# Patient Record
Sex: Female | Born: 1940 | Race: White | Hispanic: No | Marital: Married | Smoking: Former smoker
Health system: Southern US, Community
[De-identification: ages and names within clinical notes are randomized; demographics above are authoritative.]

## PROBLEM LIST (undated history)

## (undated) DIAGNOSIS — R519 Headache, unspecified: Secondary | ICD-10-CM

## (undated) DIAGNOSIS — I509 Heart failure, unspecified: Secondary | ICD-10-CM

## (undated) DIAGNOSIS — I429 Cardiomyopathy, unspecified: Secondary | ICD-10-CM

## (undated) DIAGNOSIS — M1990 Unspecified osteoarthritis, unspecified site: Secondary | ICD-10-CM

## (undated) DIAGNOSIS — F32A Depression, unspecified: Secondary | ICD-10-CM

## (undated) DIAGNOSIS — I2510 Atherosclerotic heart disease of native coronary artery without angina pectoris: Secondary | ICD-10-CM

## (undated) DIAGNOSIS — R569 Unspecified convulsions: Secondary | ICD-10-CM

## (undated) DIAGNOSIS — E785 Hyperlipidemia, unspecified: Secondary | ICD-10-CM

## (undated) DIAGNOSIS — Q780 Osteogenesis imperfecta: Secondary | ICD-10-CM

## (undated) DIAGNOSIS — G459 Transient cerebral ischemic attack, unspecified: Secondary | ICD-10-CM

## (undated) DIAGNOSIS — I10 Essential (primary) hypertension: Secondary | ICD-10-CM

## (undated) HISTORY — PX: CARDIAC CATHETERIZATION: SHX172

## (undated) HISTORY — PX: APPENDECTOMY: SHX54

## (undated) HISTORY — PX: CHOLECYSTECTOMY: SHX55

## (undated) HISTORY — PX: PATELLA FRACTURE SURGERY: SHX735

## (undated) HISTORY — PX: ABDOMINAL HYSTERECTOMY: SHX81

## (undated) HISTORY — PX: FRACTURE SURGERY: SHX138

---

## 1998-01-02 ENCOUNTER — Inpatient Hospital Stay (HOSPITAL_COMMUNITY): Admission: EM | Admit: 1998-01-02 | Discharge: 1998-01-04 | Payer: Self-pay | Admitting: Emergency Medicine

## 1998-01-08 ENCOUNTER — Emergency Department (HOSPITAL_COMMUNITY): Admission: EM | Admit: 1998-01-08 | Discharge: 1998-01-08 | Payer: Self-pay | Admitting: Emergency Medicine

## 1998-01-20 ENCOUNTER — Other Ambulatory Visit: Admission: RE | Admit: 1998-01-20 | Discharge: 1998-01-20 | Payer: Self-pay | Admitting: *Deleted

## 1998-02-14 ENCOUNTER — Other Ambulatory Visit: Admission: RE | Admit: 1998-02-14 | Discharge: 1998-02-14 | Payer: Self-pay | Admitting: *Deleted

## 1998-03-19 ENCOUNTER — Inpatient Hospital Stay (HOSPITAL_COMMUNITY): Admission: EM | Admit: 1998-03-19 | Discharge: 1998-03-20 | Payer: Self-pay | Admitting: Emergency Medicine

## 1998-03-28 ENCOUNTER — Ambulatory Visit (HOSPITAL_COMMUNITY): Admission: RE | Admit: 1998-03-28 | Discharge: 1998-03-28 | Payer: Self-pay | Admitting: *Deleted

## 1998-04-03 ENCOUNTER — Emergency Department (HOSPITAL_COMMUNITY): Admission: EM | Admit: 1998-04-03 | Discharge: 1998-04-03 | Payer: Self-pay | Admitting: Emergency Medicine

## 1998-04-13 ENCOUNTER — Ambulatory Visit (HOSPITAL_COMMUNITY): Admission: RE | Admit: 1998-04-13 | Discharge: 1998-04-13 | Payer: Self-pay | Admitting: *Deleted

## 1998-12-14 ENCOUNTER — Encounter: Payer: Self-pay | Admitting: Emergency Medicine

## 1998-12-14 ENCOUNTER — Inpatient Hospital Stay (HOSPITAL_COMMUNITY): Admission: EM | Admit: 1998-12-14 | Discharge: 1998-12-19 | Payer: Self-pay | Admitting: Emergency Medicine

## 1999-05-14 ENCOUNTER — Encounter: Payer: Self-pay | Admitting: *Deleted

## 1999-05-14 ENCOUNTER — Inpatient Hospital Stay (HOSPITAL_COMMUNITY): Admission: EM | Admit: 1999-05-14 | Discharge: 1999-05-16 | Payer: Self-pay | Admitting: Emergency Medicine

## 1999-05-15 ENCOUNTER — Encounter: Payer: Self-pay | Admitting: Cardiology

## 1999-07-22 ENCOUNTER — Encounter: Payer: Self-pay | Admitting: Emergency Medicine

## 1999-07-22 ENCOUNTER — Emergency Department (HOSPITAL_COMMUNITY): Admission: EM | Admit: 1999-07-22 | Discharge: 1999-07-22 | Payer: Self-pay | Admitting: Emergency Medicine

## 1999-10-27 ENCOUNTER — Encounter: Admission: RE | Admit: 1999-10-27 | Discharge: 1999-10-27 | Payer: Self-pay | Admitting: General Surgery

## 1999-10-27 ENCOUNTER — Encounter: Payer: Self-pay | Admitting: General Surgery

## 1999-10-31 ENCOUNTER — Encounter (INDEPENDENT_AMBULATORY_CARE_PROVIDER_SITE_OTHER): Payer: Self-pay | Admitting: *Deleted

## 1999-10-31 ENCOUNTER — Ambulatory Visit (HOSPITAL_BASED_OUTPATIENT_CLINIC_OR_DEPARTMENT_OTHER): Admission: RE | Admit: 1999-10-31 | Discharge: 1999-10-31 | Payer: Self-pay | Admitting: General Surgery

## 2000-01-01 ENCOUNTER — Encounter: Payer: Self-pay | Admitting: Emergency Medicine

## 2000-01-01 ENCOUNTER — Emergency Department (HOSPITAL_COMMUNITY): Admission: EM | Admit: 2000-01-01 | Discharge: 2000-01-01 | Payer: Self-pay | Admitting: Emergency Medicine

## 2000-06-01 ENCOUNTER — Encounter: Payer: Self-pay | Admitting: *Deleted

## 2000-06-01 ENCOUNTER — Emergency Department (HOSPITAL_COMMUNITY): Admission: EM | Admit: 2000-06-01 | Discharge: 2000-06-02 | Payer: Self-pay | Admitting: *Deleted

## 2001-03-24 ENCOUNTER — Emergency Department (HOSPITAL_COMMUNITY): Admission: EM | Admit: 2001-03-24 | Discharge: 2001-03-24 | Payer: Self-pay | Admitting: Emergency Medicine

## 2001-03-24 ENCOUNTER — Encounter: Payer: Self-pay | Admitting: Emergency Medicine

## 2001-06-18 ENCOUNTER — Other Ambulatory Visit: Admission: RE | Admit: 2001-06-18 | Discharge: 2001-06-18 | Payer: Self-pay | Admitting: *Deleted

## 2001-12-20 ENCOUNTER — Encounter: Payer: Self-pay | Admitting: Emergency Medicine

## 2001-12-20 ENCOUNTER — Emergency Department (HOSPITAL_COMMUNITY): Admission: EM | Admit: 2001-12-20 | Discharge: 2001-12-20 | Payer: Self-pay | Admitting: Emergency Medicine

## 2002-01-12 ENCOUNTER — Ambulatory Visit (HOSPITAL_COMMUNITY): Admission: RE | Admit: 2002-01-12 | Discharge: 2002-01-12 | Payer: Self-pay | Admitting: Internal Medicine

## 2002-04-15 ENCOUNTER — Encounter: Payer: Self-pay | Admitting: Emergency Medicine

## 2002-04-16 ENCOUNTER — Encounter (INDEPENDENT_AMBULATORY_CARE_PROVIDER_SITE_OTHER): Payer: Self-pay | Admitting: Cardiology

## 2002-04-16 ENCOUNTER — Inpatient Hospital Stay (HOSPITAL_COMMUNITY): Admission: EM | Admit: 2002-04-16 | Discharge: 2002-04-16 | Payer: Self-pay | Admitting: Emergency Medicine

## 2002-04-16 ENCOUNTER — Encounter: Payer: Self-pay | Admitting: Neurology

## 2003-03-16 ENCOUNTER — Ambulatory Visit (HOSPITAL_COMMUNITY): Admission: RE | Admit: 2003-03-16 | Discharge: 2003-03-16 | Payer: Self-pay | Admitting: Gastroenterology

## 2003-05-31 ENCOUNTER — Emergency Department (HOSPITAL_COMMUNITY): Admission: EM | Admit: 2003-05-31 | Discharge: 2003-05-31 | Payer: Self-pay | Admitting: Emergency Medicine

## 2003-07-27 ENCOUNTER — Ambulatory Visit (HOSPITAL_COMMUNITY): Admission: RE | Admit: 2003-07-27 | Discharge: 2003-07-27 | Payer: Self-pay | Admitting: Cardiology

## 2003-11-12 ENCOUNTER — Encounter: Admission: RE | Admit: 2003-11-12 | Discharge: 2003-11-12 | Payer: Self-pay | Admitting: Family Medicine

## 2004-08-24 ENCOUNTER — Encounter: Admission: RE | Admit: 2004-08-24 | Discharge: 2004-08-24 | Payer: Self-pay | Admitting: Cardiology

## 2005-08-15 ENCOUNTER — Encounter: Admission: RE | Admit: 2005-08-15 | Discharge: 2005-08-15 | Payer: Self-pay | Admitting: Internal Medicine

## 2005-10-29 ENCOUNTER — Encounter: Admission: RE | Admit: 2005-10-29 | Discharge: 2005-10-29 | Payer: Self-pay | Admitting: Internal Medicine

## 2005-11-28 ENCOUNTER — Encounter: Payer: Self-pay | Admitting: Emergency Medicine

## 2007-12-03 ENCOUNTER — Emergency Department (HOSPITAL_COMMUNITY): Admission: EM | Admit: 2007-12-03 | Discharge: 2007-12-03 | Payer: Self-pay | Admitting: Emergency Medicine

## 2009-07-06 ENCOUNTER — Encounter: Admission: RE | Admit: 2009-07-06 | Discharge: 2009-07-06 | Payer: Self-pay | Admitting: Internal Medicine

## 2010-03-07 ENCOUNTER — Inpatient Hospital Stay (HOSPITAL_COMMUNITY)
Admission: EM | Admit: 2010-03-07 | Discharge: 2010-03-09 | Payer: Self-pay | Source: Home / Self Care | Admitting: Emergency Medicine

## 2010-07-24 ENCOUNTER — Emergency Department (HOSPITAL_COMMUNITY)
Admission: EM | Admit: 2010-07-24 | Discharge: 2010-07-24 | Payer: Self-pay | Source: Home / Self Care | Admitting: Emergency Medicine

## 2010-10-13 LAB — POCT CARDIAC MARKERS
CKMB, poc: <1 ng/mL — ABNORMAL LOW (ref 1.0–8.0)
Myoglobin, poc: 57 ng/mL (ref 12–200)
Troponin i, poc: <0.05 ng/mL (ref 0.00–0.09)

## 2010-10-13 LAB — DIFFERENTIAL
Lymphs Abs: 2.8 10*3/uL (ref 0.7–4.0)
Monocytes Absolute: 0.6 10*3/uL (ref 0.1–1.0)
Monocytes Relative: 9 % (ref 3–12)
Neutro Abs: 2.9 10*3/uL (ref 1.7–7.7)
Neutrophils Relative %: 45 % (ref 43–77)

## 2010-10-13 LAB — URINALYSIS, ROUTINE W REFLEX MICROSCOPIC
Glucose, UA: NEGATIVE mg/dL
Hgb urine dipstick: NEGATIVE
Specific Gravity, Urine: 1.014 (ref 1.005–1.030)
pH: 7.5 (ref 5.0–8.0)

## 2010-10-13 LAB — CBC
HCT: 32.3 % — ABNORMAL LOW (ref 36.0–46.0)
MCHC: 33.1 g/dL (ref 30.0–36.0)
MCV: 89.2 fL (ref 78.0–100.0)
Platelets: 232 10*3/uL (ref 150–400)
RDW: 15 % (ref 11.5–15.5)
WBC: 6.5 10*3/uL (ref 4.0–10.5)

## 2010-10-13 LAB — COMPREHENSIVE METABOLIC PANEL
Albumin: 3 g/dL — ABNORMAL LOW (ref 3.5–5.2)
BUN: 17 mg/dL (ref 6–23)
Calcium: 8.4 mg/dL (ref 8.4–10.5)
Glucose, Bld: 102 mg/dL — ABNORMAL HIGH (ref 70–99)
Sodium: 141 mEq/L (ref 135–145)
Total Protein: 5.2 g/dL — ABNORMAL LOW (ref 6.0–8.3)

## 2010-10-13 LAB — TSH: TSH: 0.698 u[IU]/mL (ref 0.350–4.500)

## 2010-10-13 LAB — APTT: aPTT: 38 seconds — ABNORMAL HIGH (ref 24–37)

## 2010-10-13 LAB — CK TOTAL AND CKMB (NOT AT ARMC): Relative Index: INVALID (ref 0.0–2.5)

## 2010-10-13 LAB — PROTIME-INR
INR: 2.18 — ABNORMAL HIGH (ref 0.00–1.49)
Prothrombin Time: 22.1 seconds — ABNORMAL HIGH (ref 11.6–15.2)
Prothrombin Time: 24.4 seconds — ABNORMAL HIGH (ref 11.6–15.2)

## 2010-10-13 LAB — CARDIAC PANEL(CRET KIN+CKTOT+MB+TROPI)
Relative Index: INVALID (ref 0.0–2.5)
Total CK: 50 U/L (ref 7–177)
Total CK: 57 U/L (ref 7–177)

## 2010-10-13 LAB — LIPID PANEL
LDL Cholesterol: 35 mg/dL (ref 0–99)
VLDL: 13 mg/dL (ref 0–40)

## 2010-10-13 LAB — TROPONIN I: Troponin I: 0.01 ng/mL (ref 0.00–0.06)

## 2010-12-15 NOTE — Op Note (Signed)
   NAME:  Anna Salazar, Anna Salazar                       ACCOUNT NO.:  [id number]   MEDICAL RECORD NO.:  [id number]                   PATIENT TYPE:  AMB   LOCATION:  ENDO                                 FACILITY:  [facility]   PHYSICIAN:  John C. Madilyn Fireman, M.D.                 DATE OF BIRTH:  [birth year]   DATE OF PROCEDURE:  03/16/2003  DATE OF DISCHARGE:                                 OPERATIVE REPORT   PROCEDURE:  Colonoscopy.   INDICATIONS FOR PROCEDURE:  Colon cancer screening in Salazar 70 year old patient.   DESCRIPTION OF PROCEDURE:  The patient was placed in the left lateral  decubitus position and placed on the pulse monitor with continuous low-flow  oxygen delivered by nasal cannula.  She was sedated with 100 mcg IV  fentanyl, 9 mg IV Versed.  The Olympus video colonoscope was inserted into  the rectum and advanced to the cecum, confirmed by transillumination of  McBurney's point and visualization of the ileocecal valve and appendiceal  orifice.  Prep was excellent.  The cecum, ascending, transverse, descending,  and sigmoid colon all appeared normal with no masses, polyps, diverticula,  or other mucosal abnormalities.  The rectum likewise appeared normal and  retroflexed view of the anus revealed no obvious internal hemorrhoids.  The  colonoscope was then withdrawn and the patient returned to the recovery room  in stable condition.  She tolerated the procedure well and there were no  immediate complications.   IMPRESSION:  Normal screening colonoscopy.   PLAN:  The next colon screening by sigmoidoscopy in five years.                                                John C. Madilyn Fireman, M.D.    JCH/MEDQ  D:  03/16/2003  T:  03/16/2003  Job:  [id number]   cc:   Joycelyn Rua, M.D.  [address]  [place] [address]  Fax: [phone]

## 2010-12-15 NOTE — Op Note (Signed)
[facility]  Patient:    [name]                     MRN: [id number] Adm. Date:  [id number] Disc. Date: [id number] Attending:  Lorre Nick                           Operative Report  NO DICTATION DD:  10/31/99 TD:  10/31/99 Job: [id number]

## 2010-12-15 NOTE — H&P (Signed)
NAME:  Anna Salazar, Anna Salazar                       ACCOUNT NO.:  [id number]   MEDICAL RECORD NO.:  [id number]                   PATIENT TYPE:  EMS   LOCATION:  MAJO                                 FACILITY:  [facility]   PHYSICIAN:  Marlan Palau, M.D.               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  This patient has apparently had a documented low ejection  fraction followed by Dr. Caprice Kluver.  The patient has been on Coumadin and  returns to the emergency room tonight with a transient episode of left-sided  weakness involving the face, arm, and leg lasting about an hour and 15  minutes.  The patient noted a numb tingling sensation.  Some discomfort on  the right side as well.  She denies any visual field changes.  She did note  a generalized headache with the above event.  The patient had slurring of  speech.  The patient was still able to ambulate but had some difficulty with  this at times.  The patient has had a CT scan of the brain that was  unremarkable.  She is on Coumadin with an INR of 3.5.  Neurology was called  for an evaluation.   PAST MEDICAL HISTORY:  1. TIA event with left body symptoms, numbness, and weakness.  2. Congestive heart failure with a low ejection fraction of around 20%.  3. Hypercholesterolemia.  4. Gallbladder surgery.  5. History of hysterectomy.  6. History of right knee surgery.  7. History of right ankle surgery.  8. History of seizure in the past.   CURRENT MEDICATIONS:  1. Coumadin 7.5 mg q.d. except for on Sundays when she gets no medication.  2. Lipitor 20 mg q.d.  3. Lisinopril 10 mg q.d.  4. Prilosec 20 mg q.d.  5. Digoxin 0.125 mg q.d.  6. Celexa 40 mg q.d.  7. Calcium and  vitamin D 600 mg 1 t.i.d.  8. Miacalcin nasal spray q.d. alternate nostrils every other day.  9. Coreg 6.25 mg 1 p.o. b.i.d.  10.      Lasix 40 mg tablets taken 2 q.d.  11.      The patient takes sublingual nitroglycerin if needed.  12.      IV Tegretol.   SOCIAL HISTORY:  She does not smoke or drink.  This patient is married and  is medical disability.  She has 3 children who are alive and well.   FAMILY HISTORY:  Mother died of old age at 80.  Father died after being hit  by a train.  The patient has 8 sisters and 1 brother.  One sister died with  a brain cancer, one brother died with an MI.   REVIEW OF SYSTEMS:  No fevers or chills.  The patient did note a headache  today.  She usually does not have headaches.  She notes slight shortness of  breath.  She denies neck pain.  She denies chest pains.  She had some nausea  and vomiting today.  She had dizziness at the onset of her current  symptomatology.  The patient denies problems controlling her bowels or  bladder.  She denies any blackout episodes.  The patient does report a prior  history of seizures in the past.  She is not currently being treated for  seizures.   PHYSICAL EXAMINATION:  VITAL SIGNS: Blood pressure is 122/56, heart rate is  45, respiratory rate 22, temperature afebrile.  GENERAL: This patient is a fairly well-developed white female who is alert  and cooperative at the time of examination.  HEENT: Head is atraumatic.  Eyes, pupils are equal, round, and reactive to  light.  Disks are flat bilaterally.  NECK: Supple.  No carotid bruits noted.  RESPIRATORY: Clear.  CARDIOVASCULAR: Reveals a regular rate and rhythm without obvious murmurs or  rubs noted.  EXTREMITIES: Without significant edema.  NEUROLOGIC: Cranial nerves as above.  Facial asymmetry is presented.  The  patient has good sensation to pinprick and soft touch bilaterally.  She has  good strength to facial muscles and the muscles  __________bilaterally.  Speech is well enunciated and not aphasic.  Again visual fields are full.  Motor strength reveals 5/5.  Strength in all four is good and symmetric.  Motor tone is noted throughout.  Sensory touching is intact to pinprick,  soft touch, and vibration throughout.  The patient has good finger-to-nose,  finger-to-toe bilaterally.  Gait was not tested.  The patient has good  asymmetry to reflexes upper and lower extremities.  Toes are neutral to  downgoing bilaterally.  No pronator drift is seen again.   LABORATORY DATA:  White count of 8.4, hemoglobin of 11.6, hematocrit of  33.9, MCV of 85.9, platelets 268, INR of 3.5.  Sodium 137, potassium 2.8,  chloride of 104, CO2 of 27, glucose of 94, BUN of 18, creatinine of 1.4,  calcium 8.7.  CK level 61.  MB fraction 0.5.  Troponin I level 0.01.   A chest x-ray and EKG are pending at this time.   IMPRESSION:  1. Probable transient ischemic event involving the right brain left body.  2. Congestive heart failure.  3. Hypertension.  4. Hyperlipidemia.   PLAN:  The patient does have multiple risk factors of stroke.  The patient  is on chronic Coumadin therapy with a therapeutic INR.  The patient had a  TIA event on full Coumadinization.  We will pursue a further workup to rule  out surgical and minimal disease such as carotid stenosis.  The patient may  benefit from aspirin Coumadin combination at this point.  1. Initiate Coumadin and aspirin at this point.  2. Carotid Doppler study.  3.     2-D echocardiogram.  4. Gentle fluid hydration.  We will follow up the patient's clinical course while in house.                                                Marlan Palau, M.D.    CKW/MEDQ  D:  04/16/2002  T:  04/17/2002  Job:  [id number]   cc:   Thereasa Solo. Little, M.D.   Lovenia Kim, D.O.   [facility]  [address]

## 2010-12-15 NOTE — Op Note (Signed)
[place]  Patient:    Anna Salazar                     MRN: [id number] Proc. Date: 10/31/99 Adm. Date:  [id number] Disc. Date: [id number] Attending:  Lorre Nick CC:         Ammie Dalton, M.D.             Thereasa Solo. Little, M.D.                           Operative Report  PREOPERATIVE DIAGNOSIS:  Left breast mass x 2.  POSTOPERATIVE DIAGNOSIS:  Left breast x 2.  PROCEDURE:  Excision of left breast masses.  SURGEON:  Adolph Pollack, M.D.  ANESTHESIA:  Local (1% lidocaine with epinephrine plus 0.5% plain Marcaine), with MAC.  INDICATIONS:  This 70 year old female has palpable breast masses in the 4 oclock position of the left breast.  They do not show up on a mammogram.  She now presents for an excision biopsy.  She is on Coumadin, and this has been held for five days.  DESCRIPTION OF PROCEDURE:  She is placed supine on the operating room table. The masses were palpated and marked in the 4 oclock position.  She was then given intravenous sedation and the left breast was sterilely prepped and draped.  A local anesthetic was infiltrated, in a curvilinear fashion in the circumareolar area rom the 3 oclock to the 8 oclock position.  Subsequently an incision was made in the circumareolar area, incising the skin sharply and dividing the dermis with the cautery.  The skin flaps were raised in all directions.  The masses were grasped with Allis forceps and excised using the cautery.  Normal breast tissue was excised around the masses.  They were sent pressed to pathology.  The biopsy bed was then examined and the bleeding points controlled with cautery.  The wound was then closed in two layers by loosely approximating the subcutaneous tissue with interrupted #3-0 Vicryl sutures, and closing the skin with a running #4-0 monocryl subcuticular stitch.  Steri-Strips and sterile dressings were applied.  She tolerated the  procedure well without any apparent complications.  She was taken to the recovery room in satisfactory condition. DD:  10/31/99 TD:  10/31/99 Job: [id number]

## 2010-12-15 NOTE — Discharge Summary (Signed)
Anna Salazar, Anna Salazar                       ACCOUNT NO.:  [id number]   MEDICAL RECORD NO.:  [id number]                   PATIENT TYPE:  INP   LOCATION:  3034                                 FACILITY:  [facility]   PHYSICIAN:  Marlan Palau, M.D.               DATE OF BIRTH:  [birth year]   DATE OF ADMISSION:  04/15/2002  DATE OF DISCHARGE:  04/16/2002                                 DISCHARGE SUMMARY   DISCHARGE DIAGNOSES:  1. Right brain transischemic attack, thrombotic.  2. 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  3. Dyslipidemia.  4. Hypertension.  5. Status post hysterectomy.  6. Status post right knee surgery.  7. Status post right ankle surgery.  8. History of seizure in the past.   DISCHARGE MEDICATIONS:  1. Aspirin 81 mg q.d.  2. Coumadin 7.5 mg q.d.  3. Lasix 40 mg three q.d.  4. Lipitor 20 mg q.d.  5. Lisinopril 10 mg q.d.  6. Prilosec 20 mg two q.d.  7. _________ 125 mcg q.d.  8. Celexa 40 mg q.d.  9. Calcium plus vitamin B 100 mg t.i.d.  10.      K-Dur 20 mEq two q.d.  11.      Clonazepam 0.5 mg b.i.d.  12.      Coreg 6.25 mg b.i.d.  13.      Miacalcin spray q.d.  14.      Nitroglycerin p.r.n.   STUDIES PERFORMED:  1. CT of the head, which was normal.  2. Carotid Doppler, which was normal.  3. 2-D echocardiogram, results pending at time of discharge.   LABORATORY DATA:  Digoxin level 1.1, INR 3.5.  Cardiac enzymes are negative.  BMET is within normal limits.  Hemoglobin is normal at 11.6, TSH and sed  rate are both normal.  Lipids and homocysteine are pending at time of  discharge.   HISTORY OF PRESENT ILLNESS:  The patient is a 70 year old, right-handed,  white female with a history of multiple stroke risk factors, who has had  TIAs in the past with no stroke.  She has history of left brain TIA, but on  the day of admission had a right brain TIA which included left face, arm,  and leg weakness, accompanied by slurred speech and unsteady gait.  Patient  complained of generalized headaches.  The deficits resolved in approximately  one hour and 15 minutes.  She was bought to the emergency room for further  follow-up where CT of the head was negative.  Neurology was called and the  patient was admitted for further _______ work-up.   HOSPITAL COURSE:  The patient was on Coumadin prior to admission and the  level was therapeutic at admission with an INR of 3.8.  She is on Coumadin  secondary to severe cardiomyopathy.  It was felt unlikely that cardiac  embolic source was responsible.  Patient was given IV hydration and Dopplers  were negative.  2-D echocardiogram is pending at time of discharge and feel  that will not change our treatment course.  Plan to continue Coumadin and  add low dose aspirin for TIA prophylaxis.  Will follow-up with homocysteine  and lipid levels after discharge and make adjustments as needed.  Will  discharge patient home with husband.   CONDITION ON DISCHARGE:  Patient remains neurologically intact.  She was  alert and oriented x3.  Her speech is clear.  She has no aphasia.  No facial  weakness.  Her visual fields are full.  Her extraocular movements are intact  with some ___-gauze nystagmus (she can ______.)  Strength is normal.  Sensation is intact.  Gait is steady.  Romberg is negative.  DTRs are equal  bilaterally, 2+ upper extremity and decreased in low extremities.   DISCHARGE PLAN:  1. Discharge home with husband.  2. Add low dose aspirin to current Coumadin dose.  3. Follow-up with primary physician within the next month.  4. No need for neurological follow-up at this time.     Annie Main, N.P.                         Marlan Palau, M.D.    SB/MEDQ  D:  04/16/2002  T:  04/20/2002  Job:  [id number]

## 2012-05-19 ENCOUNTER — Encounter (HOSPITAL_COMMUNITY): Payer: Self-pay | Admitting: Internal Medicine

## 2012-05-19 ENCOUNTER — Inpatient Hospital Stay (HOSPITAL_COMMUNITY)
Admission: EM | Admit: 2012-05-19 | Discharge: 2012-05-21 | DRG: 101 | Disposition: A | Discharge status: Home Health | Payer: Medicare Other | Attending: Internal Medicine | Admitting: Internal Medicine

## 2012-05-19 ENCOUNTER — Emergency Department (HOSPITAL_COMMUNITY): Payer: Medicare Other

## 2012-05-19 DIAGNOSIS — M6281 Muscle weakness (generalized): Secondary | ICD-10-CM

## 2012-05-19 DIAGNOSIS — N183 Chronic kidney disease, stage 3 unspecified: Secondary | ICD-10-CM | POA: Diagnosis present

## 2012-05-19 DIAGNOSIS — D6489 Other specified anemias: Secondary | ICD-10-CM | POA: Diagnosis present

## 2012-05-19 DIAGNOSIS — N182 Chronic kidney disease, stage 2 (mild): Secondary | ICD-10-CM | POA: Diagnosis present

## 2012-05-19 DIAGNOSIS — Z7982 Long term (current) use of aspirin: Secondary | ICD-10-CM

## 2012-05-19 DIAGNOSIS — G40802 Other epilepsy, not intractable, without status epilepticus: Principal | ICD-10-CM | POA: Diagnosis present

## 2012-05-19 DIAGNOSIS — G40909 Epilepsy, unspecified, not intractable, without status epilepticus: Secondary | ICD-10-CM | POA: Diagnosis present

## 2012-05-19 DIAGNOSIS — Z8673 Personal history of transient ischemic attack (TIA), and cerebral infarction without residual deficits: Secondary | ICD-10-CM

## 2012-05-19 DIAGNOSIS — R531 Weakness: Secondary | ICD-10-CM | POA: Diagnosis present

## 2012-05-19 DIAGNOSIS — I10 Essential (primary) hypertension: Secondary | ICD-10-CM | POA: Diagnosis present

## 2012-05-19 DIAGNOSIS — R569 Unspecified convulsions: Secondary | ICD-10-CM | POA: Diagnosis present

## 2012-05-19 DIAGNOSIS — Z8669 Personal history of other diseases of the nervous system and sense organs: Secondary | ICD-10-CM

## 2012-05-19 DIAGNOSIS — I428 Other cardiomyopathies: Secondary | ICD-10-CM | POA: Diagnosis present

## 2012-05-19 DIAGNOSIS — E785 Hyperlipidemia, unspecified: Secondary | ICD-10-CM | POA: Diagnosis present

## 2012-05-19 DIAGNOSIS — I129 Hypertensive chronic kidney disease with stage 1 through stage 4 chronic kidney disease, or unspecified chronic kidney disease: Secondary | ICD-10-CM | POA: Diagnosis present

## 2012-05-19 DIAGNOSIS — R296 Repeated falls: Secondary | ICD-10-CM

## 2012-05-19 DIAGNOSIS — Z9181 History of falling: Secondary | ICD-10-CM

## 2012-05-19 DIAGNOSIS — G8389 Other specified paralytic syndromes: Secondary | ICD-10-CM | POA: Diagnosis present

## 2012-05-19 DIAGNOSIS — Z79899 Other long term (current) drug therapy: Secondary | ICD-10-CM

## 2012-05-19 DIAGNOSIS — D649 Anemia, unspecified: Secondary | ICD-10-CM | POA: Diagnosis present

## 2012-05-19 DIAGNOSIS — R55 Syncope and collapse: Secondary | ICD-10-CM

## 2012-05-19 DIAGNOSIS — Z23 Encounter for immunization: Secondary | ICD-10-CM

## 2012-05-19 HISTORY — DX: Hyperlipidemia, unspecified: E78.5

## 2012-05-19 HISTORY — DX: Essential (primary) hypertension: I10

## 2012-05-19 HISTORY — DX: Unspecified convulsions: R56.9

## 2012-05-19 HISTORY — DX: Atherosclerotic heart disease of native coronary artery without angina pectoris: I25.10

## 2012-05-19 HISTORY — DX: Heart failure, unspecified: I50.9

## 2012-05-19 HISTORY — DX: Transient cerebral ischemic attack, unspecified: G45.9

## 2012-05-19 HISTORY — DX: Cardiomyopathy, unspecified: I42.9

## 2012-05-19 LAB — COMPREHENSIVE METABOLIC PANEL
ALT: 9 U/L (ref 0–35)
Alkaline Phosphatase: 37 U/L — ABNORMAL LOW (ref 39–117)
CO2: 28 mEq/L (ref 19–32)
GFR calc Af Amer: 35 mL/min — ABNORMAL LOW (ref >90)
GFR calc non Af Amer: 30 mL/min — ABNORMAL LOW (ref >90)
Glucose, Bld: 104 mg/dL — ABNORMAL HIGH (ref 70–99)
Potassium: 4.1 mEq/L (ref 3.5–5.1)
Sodium: 141 mEq/L (ref 135–145)

## 2012-05-19 LAB — CBC
MCV: 86.6 fL (ref 78.0–100.0)
Platelets: 247 10*3/uL (ref 150–400)
RBC: 4.19 MIL/uL (ref 3.87–5.11)
WBC: 8.3 10*3/uL (ref 4.0–10.5)

## 2012-05-19 LAB — DIFFERENTIAL
Lymphocytes Relative: 49 % — ABNORMAL HIGH (ref 12–46)
Lymphs Abs: 4.1 10*3/uL — ABNORMAL HIGH (ref 0.7–4.0)
Neutrophils Relative %: 38 % — ABNORMAL LOW (ref 43–77)

## 2012-05-19 LAB — PROTIME-INR: Prothrombin Time: 28 seconds — ABNORMAL HIGH (ref 11.6–15.2)

## 2012-05-19 MED ORDER — SODIUM CHLORIDE 0.9 % IV BOLUS (SEPSIS)
500.0000 mL | Freq: Once | INTRAVENOUS | Status: AC
  Administered 2012-05-19: 500 mL via INTRAVENOUS

## 2012-05-19 MED ORDER — LEVETIRACETAM 250 MG PO TABS
250.0000 mg | ORAL_TABLET | Freq: Two times a day (BID) | ORAL | Status: DC
  Administered 2012-05-19 – 2012-05-21 (×4): 250 mg via ORAL
  Filled 2012-05-19 (×5): qty 1

## 2012-05-19 NOTE — Code Documentation (Signed)
Patient was at home in normal health today with husband at dinner around 1630, around 1700 she decided to use the bathroom and shower. Around 1800 patient's husband heard the patient call out and when he arrived to the bathroom she was face first on the bathroom floor. Patient arrived via EMS at 2017, EDP exam at 2033, Code stroke called at 2049, stroke team arrived at 2045, phlebotomist arrived at 2050, patient arrived in CT at 2050, CT read by Dr. Amada Jupiter at 2055. NIH 0, code stroke cancelled at 2120. Patient has history of seizures and syncopal episodes. Will continue to monitor

## 2012-05-19 NOTE — ED Notes (Signed)
MD at bedside. 

## 2012-05-19 NOTE — ED Notes (Addendum)
Old and new EKG performed and given to Dr. Aubery Lapping

## 2012-05-19 NOTE — ED Notes (Signed)
Keppra requested from pharmacy.

## 2012-05-19 NOTE — ED Provider Notes (Signed)
I saw and evaluated the patient, reviewed the resident's note and I agree with the findings and plan. Agree with EKG interpretation if present.   Pt with fall, ?LOC in bathroom followed by L sided weakness brought to the ED. Last seen normal is not clear but husband reports approx 1815hrs. Code Stroke called but after further questioning appears this may have been seizure with Todd's paralysis. Code Stroke cancelled. Neuro recommends Medicine admit for further eval.   Charles B. Bernette Mayers, MD 05/19/12 2204

## 2012-05-19 NOTE — ED Provider Notes (Signed)
History     CSN: [id number]  Arrival date & time 05/19/12  2017   First MD Initiated Contact with Patient 05/19/12 2031      Chief Complaint  Patient presents with  . Weakness    (Consider location/radiation/quality/duration/timing/severity/associated sxs/prior treatment) Patient is a 71 y.o. female presenting with neurologic complaint. The history is provided by the patient, the spouse and a relative.  Neurologic Problem The primary symptoms include syncope, loss of consciousness, focal weakness (left side of body) and speech change (unable to talk for about 30 minutes after LOC). Primary symptoms do not include headaches, altered mental status, dizziness, visual change, paresthesias, loss of sensation, memory loss, fever, nausea or vomiting. Seizures: possibly, unsure. Episode onset: last seen normal at 6:15 PM today. The symptoms are improving. The neurological symptoms are focal (left side of body). The symptoms occurred following head trauma (passed out on toilet and fell forward, hitting her head).  There was loss of consciousness. The episode was not witnessed. Before the onset of the syncopal episode there was weakness (left side of body). There was no visual change, dizziness or nausea. Syncope circumstances: after using the toilet. The syncopal episode occurred with speech change (unable to talk for about 30 minutes after LOC) and focal weakness (left side of body). The syncopal episode did not occur with shortness of breath or headaches. There was post-event confusion. There was no urinary incontinence with syncope. Seizures: possibly, unsure.  Loss of consciousness began less than 1 hour ago. The loss of consciousness lasted 1 - 5 minutes. The episode was associated with trauma and defecation.  Weakness began 1 - 3 hours ago. The weakness is unchanged. There is decreased muscle function with maximum physical effort.  Region/motion of weakness: left side of body.  Change in speech  began less than 1 hour ago. The speech change is improving. Features of the speech change include inability to articulate.  Additional symptoms include weakness (left side of body).    No past medical history on file.  No past surgical history on file.  No family history on file.  History  Substance Use Topics  . Smoking status: Not on file  . Smokeless tobacco: Not on file  . Alcohol Use: Not on file    OB History    No data available      Review of Systems  Constitutional: Negative for fever.  Respiratory: Negative for cough and shortness of breath.   Cardiovascular: Positive for syncope. Negative for chest pain.  Gastrointestinal: Negative for nausea, vomiting, abdominal pain and diarrhea.  Neurological: Positive for speech change (unable to talk for about 30 minutes after LOC), focal weakness (left side of body), loss of consciousness and weakness (left side of body). Negative for dizziness, headaches and paresthesias. Seizures: possibly, unsure.  Psychiatric/Behavioral: Negative for memory loss and altered mental status.  All other systems reviewed and are negative.    Allergies  Tegretol  Home Medications  No current outpatient prescriptions on file.  BP 106/46  Pulse 70  Temp 98.4 F (36.9 C) (Oral)  Resp 15  SpO2 97%  Physical Exam  Nursing note and vitals reviewed. Constitutional: She is oriented to person, place, and time. She appears well-developed and well-nourished. No distress.  HENT:  Head: Normocephalic and atraumatic.  Eyes: EOM are normal. Pupils are equal, round, and reactive to light.  Neck: Normal range of motion.  Cardiovascular: Normal rate and normal heart sounds.   Pulmonary/Chest: Effort normal and breath sounds normal.  No respiratory distress.  Abdominal: Soft. She exhibits no distension. There is no tenderness.  Musculoskeletal: Normal range of motion.  Neurological: She is alert and oriented to person, place, and time. No cranial  nerve deficit or sensory deficit. She exhibits abnormal muscle tone. Coordination abnormal. GCS eye subscore is 4. GCS verbal subscore is 5. GCS motor subscore is 6.       3/5 strength in both left arm and leg as compared to 5/5 on right side  Skin: Skin is warm and dry.    ED Course  Procedures (including critical care time)  Labs Reviewed  PROTIME-INR - Abnormal; Notable for the following:    Prothrombin Time 28.0 (*)     INR 2.79 (*)     All other components within normal limits  APTT - Abnormal; Notable for the following:    aPTT 43 (*)     All other components within normal limits  DIFFERENTIAL - Abnormal; Notable for the following:    Neutrophils Relative 38 (*)     Lymphocytes Relative 49 (*)     Lymphs Abs 4.1 (*)     All other components within normal limits  COMPREHENSIVE METABOLIC PANEL - Abnormal; Notable for the following:    Glucose, Bld 104 (*)     Creatinine, Ser 1.63 (*)     Albumin 3.3 (*)     Alkaline Phosphatase 37 (*)     GFR calc non Af Amer 30 (*)     GFR calc Af Amer 35 (*)     All other components within normal limits  CBC  TROPONIN I  POCT I-STAT TROPONIN I   Ct Head Wo Contrast  05/19/2012  *RADIOLOGY REPORT*  Clinical Data: Fall.  Left-sided weakness.  CT HEAD WITHOUT CONTRAST  Technique:  Contiguous axial images were obtained from the base of the skull through the vertex without contrast.  Comparison: 10/29/2005  Findings: There is mild central cortical atrophy.  Mild white matter changes with small vessel disease.  There is no evidence for hemorrhage, mass lesion, or acute infarction.  Bone windows show atherosclerotic calcification of the internal carotid arteries and no fracture.  Visualized paranasal sinuses and mastoid air cells are normally aerated.  IMPRESSION:  1.  Atrophy and small vessel disease. 2. No evidence for acute  abnormality.  Critical test results telephoned to Lara Mulch at the time of interpretation on date 05/19/2012 at time 9:06  p.m.   Original Report Authenticated By: Patterson Hammersmith, M.D.     Date: 05/19/2012  Rate: 67  Rhythm: normal sinus rhythm  QRS Axis: normal  Intervals: normal  ST/T Wave abnormalities: normal  Conduction Disutrbances:left bundle branch block  Narrative Interpretation: NSR with continued LBBB  Old EKG Reviewed: unchanged     1. Left-sided weakness       MDM  Pt with new left side weakness with resolution of slurred speech and left facial droop. Last seen normal 6:15 PM. Code Stroke activated during initial evaluation due to continued left side weakness. No acute findings on head CT. Neurology saw patient and is unsure if patient is having TIAs v seizures. Will continue to work up and admit to medicine.  10:17 PM Pt will be admitted to medicine for further workup.     Daleen Bo, MD 05/19/12 [phone]

## 2012-05-19 NOTE — ED Notes (Signed)
Difficulty determining last seen normal time.

## 2012-05-19 NOTE — ED Notes (Signed)
Pt is alert, calm, cooperative, and interactive.  No changes from arrival.

## 2012-05-19 NOTE — Consult Note (Signed)
Reason for Consult: Left-sided weakness Referring Physician: Susy Frizzle  CC: Transient loss of consciousness  History is obtained from: Patient, granddaughter, husband, son  HPI: Anna Salazar is an 71 y.o. 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 She presents today after an episode of loss of consciousness, followed by left-sided weakness and numbness. She describes that she sat on the commode to start taking off her clothes so that she take a shower, and the next thing she knows she was in her bed. Her husband states that she had gone into take a shower, and then a while later he heard her cry out. When he went in, he found her laying face down on the floor. She was speaking, but mildly confused and he carried her and put her on the bed.   He noticed that the left side of her face looked like it was drooping, and she reports that she remembers the left side being numb when she first woke up.  This has since resolved.  Of note, 2-3 times per week she has episodes where she loses 15-20 minutes and will find herself somewhere and not know how she got there. And times, she will have electric-like sensation that involves her leg and arm.  Her family all is insistent, however that she has no problems with her memory and that she remembers if her son calls or not. She does not reask the same questions. They have no other concerns for her memory and other than these episodes. They have attributed them to panic attacks for which seh takes clonopin.   Her previous seizures are described as "grand mal" consisting of stiffening and falling.   She had a TIA in 2003 consisting of left-sided numbness and weakness that also was preceded by loss of consciousness.  She thinks she is on a sz medicine, but is not sure what the name is. It is not on any list that we have, though clonazepam may be what she is thinking of. Family will bring  medication list tomorrow.   Last seen normal: ? 5:30 PM TPA given: No, deficits rapidly resolved  ROS: An 11 point ROS was performed and is negative except as noted in the HPI.  PMH:  Cardiomyopathy(on coumadin) HTN hyperlipidemia TIAs(left sided symptoms)  Family History: Son and grandson with seizures.   Social History: Tob: denies  Exam: Current vital signs: BP 102/67  Pulse 72  Temp 98.4 F (36.9 C) (Oral)  Resp 12  SpO2 98% Vital signs in last 24 hours: Temp:  [98.4 F (36.9 C)] 98.4 F (36.9 C) (10/21 2033) Pulse Rate:  [72] 72  (10/21 2100) Resp:  [12-17] 12  (10/21 2100) BP: (102-122)/(67-89) 102/67 mmHg (10/21 2100) SpO2:  [96 %-98 %] 98 % (10/21 2100)  General: In bed, NAD CV: RRR Mental Status: Patient is awake, alert, oriented to person, place, month, year, and situation. Immediate and remote memory are intact. Patient is able to give a clear and coherent history. She is able to spell world backwards and give # of quarters in $2.75.  Cranial Nerves: II: Visual Fields are full. Pupils are equal, round, and reactive to light.  Discs are difficult to visualize. III,IV, VI: EOMI without ptosis or diploplia.  V,VII: Facial sensation and movement are symmetric.  VIII: hearing is intact to voice X: Uvula elevates symmetrically XI: Shoulder shrug is symmetric. XII: tongue is midline without atrophy or fasciculations.  Motor: Tone is normal. Bulk is normal. 5/5 strength was present in all four extremities.  Sensory: Sensation is symmetric to light touch and temperature in the arms and legs. She has an area on the anterior shin of her left leg with decreased pinprick sensation in roughly an L4 distribution.  Deep Tendon Reflexes: 2+ and symmetric in the biceps and reduced bilaterally at the patellae.  Plantars: Toes are downgoing bilaterally. Cerebellar: FNF are with significant tremor bilaterally Gait: Did not assess secondary to patient safety  concerns.  I have reviewed labs in epic and the results pertinent to this consultation are: Elevated Cr at 1.63  I have reviewed the images obtained:CT head - negative acute.   Impression: 71 yo F with h/o Sz and new LOC with transient left hemiparesis afterwards. This is similar to her previous event in 2003. I suspect her intermittent paresthesias and amnestic periods are recurrent seizures, and that she is having them as often as weekly. I suspect that the episode earlier tonight represents recurrent seizure as well.   Recommendations: 1) MRI brain 2) EEG in the AM 3) Would start keppra 250mg  BID given reduced GFR. 4) Could discuss need for warfarin with her cardiologist, especially if EEG is positive,  in 2011, she was documented as having normal EF. Though it is impossible to know for sure, I am not certain that her previous episodes truly represented TIAs. I do not, however, have all pertinent records to make a recommendation to this effect at this time(recent echo results, actual previous indication for anticoagulation, etc.)  Ritta Slot, MD [facility] [phone]  If 7pm- 7am, please page neurology on call at [phone].

## 2012-05-19 NOTE — ED Notes (Signed)
Per EMS, pt fell in bathroom today at 1500.  Pt states does not recall going into the bathroom or falling.  EMS reports mild left-sided weakness. Pt reports generalized weak.  Pt c/o neck pain.

## 2012-05-19 NOTE — ED Notes (Signed)
Code stroke canceled per Neuro MD

## 2012-05-19 NOTE — ED Notes (Signed)
Pt in CT now.  Lab at bedside, Rapid response at bedside.

## 2012-05-19 NOTE — ED Notes (Signed)
IV bolus started 

## 2012-05-19 NOTE — H&P (Signed)
Anna Salazar is an 71 y.o. female. Patient was seen and examined on May 19, 2012. PCP - Dr. [name]. Cardiologist - Dr. [name].       Chief Complaint: Loss of consciousness. HPI: 71 year-old female with history of hypertension hyperlipidemia and previous history of TIA and cardiomyopathy on Coumadin, was brought to the ER after patient had a brief spell of loss of consciousness while she was in the bathroom at her house. Her family members noticed a sound and she was found on the floor in the bathroom. After the episode she gained consciousness spontaneously and had left-sided weakness and numbness. The weakness lasted for around half an hour and got better spontaneously but the numbness persisted. Patient was initially brought as a code stroke was canceled later after neurologist evaluated. CT head is negative. Patient does have a history of seizures and the last time she had taken antiseizure medication was more than 10 years ago. At this time patient is admitted for further management. Earlier in the morning today patient has been having some headaches mostly in the occipital area which patient states is chronic and happens every week and gets better with Tylenol. Patient also had a brief spell of blurred vision which lasts for a few minutes and spontaneously got better in the morning. Patient takes Coumadin for cardiomyopathy. Patient has had a cardiac catheter 2 years ago which was showing normal EF.  Past Medical History  Diagnosis Date  . Seizures   . Hypertension   . Hyperlipidemia     Past Surgical History  Procedure Date  . Abdominal hysterectomy   . Appendectomy   . Cholecystectomy   . Cardiac catheterization     History reviewed. No pertinent family history. Social History:  does not have a smoking history on file. She has never used smokeless tobacco. She reports that she does not drink alcohol or use illicit drugs.  Allergies:     Allergies  Allergen Reactions   . Tegretol (Carbamazepine) Hives     (Not in a hospital admission)  Results for orders placed during the hospital encounter of 05/19/12 (from the past 48 hour(s))  PROTIME-INR     Status: Abnormal   Collection Time   05/19/12  8:55 PM      Component Value Range Comment   Prothrombin Time 28.0 (*) 11.6 - 15.2 seconds    INR 2.79 (*) 0.00 - 1.49   APTT     Status: Abnormal   Collection Time   05/19/12  8:55 PM      Component Value Range Comment   aPTT 43 (*) 24 - 37 seconds   CBC     Status: Normal   Collection Time   05/19/12  8:55 PM      Component Value Range Comment   WBC 8.3  4.0 - 10.5 K/uL    RBC 4.19  3.87 - 5.11 MIL/uL    Hemoglobin 12.2  12.0 - 15.0 g/dL    HCT 16.1  09.6 - 04.5 %    MCV 86.6  78.0 - 100.0 fL    MCH 29.1  26.0 - 34.0 pg    MCHC 33.6  30.0 - 36.0 g/dL    RDW 40.9  81.1 - 91.4 %    Platelets 247  150 - 400 K/uL   DIFFERENTIAL     Status: Abnormal   Collection Time   05/19/12  8:55 PM      Component Value Range Comment   Neutrophils  Relative 38 (*) 43 - 77 %    Neutro Abs 3.2  1.7 - 7.7 K/uL    Lymphocytes Relative 49 (*) 12 - 46 %    Lymphs Abs 4.1 (*) 0.7 - 4.0 K/uL    Monocytes Relative 8  3 - 12 %    Monocytes Absolute 0.7  0.1 - 1.0 K/uL    Eosinophils Relative 4  0 - 5 %    Eosinophils Absolute 0.3  0.0 - 0.7 K/uL    Basophils Relative 1  0 - 1 %    Basophils Absolute 0.0  0.0 - 0.1 K/uL   COMPREHENSIVE METABOLIC PANEL     Status: Abnormal   Collection Time   05/19/12  8:55 PM      Component Value Range Comment   Sodium 141  135 - 145 mEq/L    Potassium 4.1  3.5 - 5.1 mEq/L    Chloride 105  96 - 112 mEq/L    CO2 28  19 - 32 mEq/L    Glucose, Bld 104 (*) 70 - 99 mg/dL    BUN 23  6 - 23 mg/dL    Creatinine, Ser 9.14 (*) 0.50 - 1.10 mg/dL    Calcium 9.6  8.4 - 78.2 mg/dL    Total Protein 6.0  6.0 - 8.3 g/dL    Albumin 3.3 (*) 3.5 - 5.2 g/dL    AST 18  0 - 37 U/L    ALT 9  0 - 35 U/L    Alkaline Phosphatase 37 (*) 39 - 117 U/L     Total Bilirubin 0.3  0.3 - 1.2 mg/dL    GFR calc non Af Amer 30 (*) >90 mL/min    GFR calc Af Amer 35 (*) >90 mL/min   TROPONIN I     Status: Normal   Collection Time   05/19/12  8:55 PM      Component Value Range Comment   Troponin I <0.30  <0.30 ng/mL   POCT I-STAT TROPONIN I     Status: Normal   Collection Time   05/19/12  9:03 PM      Component Value Range Comment   Troponin i, poc 0.02  0.00 - 0.08 ng/mL    Comment 3             Ct Head Wo Contrast  05/19/2012  *RADIOLOGY REPORT*  Clinical Data: Fall.  Left-sided weakness.  CT HEAD WITHOUT CONTRAST  Technique:  Contiguous axial images were obtained from the base of the skull through the vertex without contrast.  Comparison: 10/29/2005  Findings: There is mild central cortical atrophy.  Mild white matter changes with small vessel disease.  There is no evidence for hemorrhage, mass lesion, or acute infarction.  Bone windows show atherosclerotic calcification of the internal carotid arteries and no fracture.  Visualized paranasal sinuses and mastoid air cells are normally aerated.  IMPRESSION:  1.  Atrophy and small vessel disease. 2. No evidence for acute  abnormality.  Critical test results telephoned to Lara Mulch at the time of interpretation on date 05/19/2012 at time 9:06 p.m.   Original Report Authenticated By: Patterson Hammersmith, M.D.     Review of Systems  Constitutional: Negative.   HENT: Negative.   Eyes: Negative.   Respiratory: Negative.   Cardiovascular: Negative.   Gastrointestinal: Negative.   Genitourinary: Negative.   Musculoskeletal: Negative.   Skin: Negative.   Neurological: Positive for focal weakness (Left sided weakness with numbness.) and loss  of consciousness.    Blood pressure 106/46, pulse 70, temperature 98.4 F (36.9 C), temperature source Oral, resp. rate 15, SpO2 97.00%. Physical Exam  Constitutional: She is oriented to person, place, and time. She appears well-developed and well-nourished. No  distress.  HENT:  Head: Normocephalic and atraumatic.  Right Ear: External ear normal.  Left Ear: External ear normal.  Nose: Nose normal.  Mouth/Throat: Oropharynx is clear and moist. No oropharyngeal exudate.  Eyes: Conjunctivae normal are normal. Pupils are equal, round, and reactive to light. Right eye exhibits no discharge. Left eye exhibits no discharge. No scleral icterus.  Neck: Normal range of motion. Neck supple.  Cardiovascular: Normal rate and regular rhythm.   Respiratory: Effort normal and breath sounds normal. No respiratory distress. She has no wheezes. She has no rales.  GI: Soft. Bowel sounds are normal. She exhibits no distension. There is no tenderness. There is no rebound.  Musculoskeletal: She exhibits no edema and no tenderness.  Neurological: She is alert and oriented to person, place, and time.       Moves all extremities 5/5. No facial asymmetry. Tongue is  Midline.  Skin: Skin is warm and dry. She is not diaphoretic.  Psychiatric: Her behavior is normal.     Assessment/Plan #1. Transient loss of consciousness with left-sided weakness which has improved at this time - patient has been already evaluated by neurologist. At this time as per neurologist patient's symptoms are concerning for recurrent seizure for which neurologist has already started patient on Keppra. Patient will be placed on neurochecks and MRI brain and EEG has been ordered. Further recommendations per neurologist. For patient's headache patient be placed on when necessary Tylenol. Check sedimentation rate. #2. History of cardiomyopathy on Coumadin - patient's last cardiac catheter done 2 years ago showed normal EF. At this time Coumadin will be continued per pharmacy. To discuss with patient's cardiologist about need for Coumadin in a.m. #3. History of hypertension and hyperlipidemia - patient's present medication doses are not known. For now I have placed patient on when necessary IV hydralazine for  systolic blood pressure more than 180.  Patient's home medications have to be verified and continued if clinically appropriate.  CODE STATUS - full code.  Eduard Clos. 05/19/2012, 10:56 PM

## 2012-05-19 NOTE — ED Notes (Signed)
Dr. Yoder at bedside.

## 2012-05-19 NOTE — ED Notes (Signed)
Back to room from CT. Neuro MD at bedside, Rapid response RN at bedside.  Labs drawn.

## 2012-05-19 NOTE — ED Notes (Signed)
Neuro MD at bedside, scan starting now.

## 2012-05-20 ENCOUNTER — Observation Stay (HOSPITAL_COMMUNITY): Payer: Medicare Other

## 2012-05-20 ENCOUNTER — Encounter (HOSPITAL_COMMUNITY): Payer: Self-pay | Admitting: *Deleted

## 2012-05-20 DIAGNOSIS — D649 Anemia, unspecified: Secondary | ICD-10-CM | POA: Diagnosis present

## 2012-05-20 DIAGNOSIS — N182 Chronic kidney disease, stage 2 (mild): Secondary | ICD-10-CM | POA: Diagnosis present

## 2012-05-20 DIAGNOSIS — R569 Unspecified convulsions: Secondary | ICD-10-CM | POA: Diagnosis present

## 2012-05-20 DIAGNOSIS — G40909 Epilepsy, unspecified, not intractable, without status epilepticus: Secondary | ICD-10-CM | POA: Diagnosis present

## 2012-05-20 DIAGNOSIS — Z9181 History of falling: Secondary | ICD-10-CM

## 2012-05-20 DIAGNOSIS — R296 Repeated falls: Secondary | ICD-10-CM

## 2012-05-20 DIAGNOSIS — N183 Chronic kidney disease, stage 3 unspecified: Secondary | ICD-10-CM | POA: Diagnosis present

## 2012-05-20 LAB — LIPID PANEL
Cholesterol: 105 mg/dL (ref 0–200)
Total CHOL/HDL Ratio: 2.1 RATIO

## 2012-05-20 LAB — CBC WITH DIFFERENTIAL/PLATELET
Eosinophils Absolute: 0.3 10*3/uL (ref 0.0–0.7)
Eosinophils Relative: 4 % (ref 0–5)
Lymphs Abs: 3.8 10*3/uL (ref 0.7–4.0)
MCH: 29 pg (ref 26.0–34.0)
MCV: 88.3 fL (ref 78.0–100.0)
Platelets: 224 10*3/uL (ref 150–400)
RBC: 3.86 MIL/uL — ABNORMAL LOW (ref 3.87–5.11)

## 2012-05-20 LAB — COMPREHENSIVE METABOLIC PANEL
AST: 16 U/L (ref 0–37)
Albumin: 2.9 g/dL — ABNORMAL LOW (ref 3.5–5.2)
Alkaline Phosphatase: 30 U/L — ABNORMAL LOW (ref 39–117)
Chloride: 109 mEq/L (ref 96–112)
Creatinine, Ser: 1.51 mg/dL — ABNORMAL HIGH (ref 0.50–1.10)
Potassium: 3.9 mEq/L (ref 3.5–5.1)
Sodium: 142 mEq/L (ref 135–145)
Total Bilirubin: 0.4 mg/dL (ref 0.3–1.2)

## 2012-05-20 LAB — SEDIMENTATION RATE: Sed Rate: 2 mm/hr (ref 0–22)

## 2012-05-20 LAB — RAPID URINE DRUG SCREEN, HOSP PERFORMED
Amphetamines: NOT DETECTED
Barbiturates: NOT DETECTED
Tetrahydrocannabinol: NOT DETECTED

## 2012-05-20 LAB — PROTIME-INR: INR: 2.79 — ABNORMAL HIGH (ref 0.00–1.49)

## 2012-05-20 LAB — GLUCOSE, CAPILLARY: Glucose-Capillary: 94 mg/dL (ref 70–99)

## 2012-05-20 LAB — TSH: TSH: 1.648 u[IU]/mL (ref 0.350–4.500)

## 2012-05-20 LAB — HEMOGLOBIN A1C: Mean Plasma Glucose: 123 mg/dL — ABNORMAL HIGH (ref <117)

## 2012-05-20 MED ORDER — ACETAMINOPHEN 325 MG PO TABS: 650.0000 mg | ORAL_TABLET | Freq: Four times a day (QID) | ORAL | Status: DC | PRN

## 2012-05-20 MED ORDER — PNEUMOCOCCAL VAC POLYVALENT 25 MCG/0.5ML IJ INJ
0.5000 mL | INJECTION | INTRAMUSCULAR | Status: AC
  Administered 2012-05-21: 0.5 mL via INTRAMUSCULAR
  Filled 2012-05-20: qty 0.5

## 2012-05-20 MED ORDER — WARFARIN SODIUM 2.5 MG PO TABS
2.5000 mg | ORAL_TABLET | ORAL | Status: DC
  Filled 2012-05-20: qty 1

## 2012-05-20 MED ORDER — HYDRALAZINE HCL 20 MG/ML IJ SOLN
10.0000 mg | INTRAMUSCULAR | Status: DC | PRN
  Filled 2012-05-20: qty 0.5

## 2012-05-20 MED ORDER — WARFARIN SODIUM 3 MG PO TABS: 3.5000 mg | ORAL_TABLET | ORAL | Status: DC

## 2012-05-20 MED ORDER — SODIUM CHLORIDE 0.9 % IV SOLN: INTRAVENOUS | Status: DC

## 2012-05-20 MED ORDER — ASPIRIN EC 81 MG PO TBEC
81.0000 mg | DELAYED_RELEASE_TABLET | Freq: Every day | ORAL | Status: DC
  Administered 2012-05-20 – 2012-05-21 (×2): 81 mg via ORAL
  Filled 2012-05-20 (×2): qty 1

## 2012-05-20 MED ORDER — WARFARIN - PHARMACIST DOSING INPATIENT: Freq: Every day | Status: DC

## 2012-05-20 NOTE — Progress Notes (Signed)
ANTICOAGULATION CONSULT NOTE - Initial Consult  Pharmacy Consult for Coumadin Indication: h/o TIA  Allergies  Allergen Reactions  . Tegretol (Carbamazepine) Hives    Patient Measurements: Height: 5\' 2"  (157.5 cm) Weight: 144 lb 10 oz (65.6 kg) IBW/kg (Calculated) : 50.1   Vital Signs: Temp: 97.7 F (36.5 C) (10/22 0042) Temp src: Oral (10/22 0042) BP: 107/36 mmHg (10/22 0042) Pulse Rate: 68  (10/22 0042)  Labs:  [facility] 05/19/12 2055  HGB 12.2  HCT 36.3  PLT 247  APTT 43*  LABPROT 28.0*  INR 2.79*  HEPARINUNFRC --  CREATININE 1.63*  CKTOTAL --  CKMB --  TROPONINI <0.30    Estimated Creatinine Clearance: 27.3 ml/min (by C-G formula based on Cr of 1.63).   Medical History: Past Medical History  Diagnosis Date  . Seizures   . Hypertension   . Hyperlipidemia     Medications:  Pt unsure of current medications.  To f/u with family in morning.  Assessment: 71 yo female admitted with LOC, h/o TIA, to continue Coumadin  Goal of Therapy:  INR 2-3 Monitor platelets by anticoagulation protocol: Yes   Plan:  F/U home Coumadin regimen in AM Daily INR  Javares Kaufhold, Gary Fleet 05/20/2012,1:10 AM

## 2012-05-20 NOTE — Progress Notes (Signed)
Utilization Review Completed.  

## 2012-05-20 NOTE — Progress Notes (Addendum)
ANTICOAGULATION CONSULT NOTE - Follow Up Consult  Pharmacy Consult for coumadin Indication: cardiomyopathy  Allergies  Allergen Reactions  . Tegretol (Carbamazepine) Hives    Patient Measurements: Height: 5\' 2"  (157.5 cm) Weight: 144 lb 10 oz (65.6 kg) IBW/kg (Calculated) : 50.1    Vital Signs: Temp: 98.4 F (36.9 C) (10/22 1000) Temp src: Oral (10/22 1000) BP: 103/48 mmHg (10/22 1000) Pulse Rate: 62  (10/22 1000)  Labs:  [facility] 05/20/12 0630 05/19/12 2055  HGB 11.2* 12.2  HCT 34.1* 36.3  PLT 224 247  APTT -- 43*  LABPROT 28.0* 28.0*  INR 2.79* 2.79*  HEPARINUNFRC -- --  CREATININE 1.51* 1.63*  CKTOTAL -- --  CKMB -- --  TROPONINI -- <0.30    Estimated Creatinine Clearance: 29.5 ml/min (by C-G formula based on Cr of 1.51).   Assessment: Patient is a 71 y.o F on coumadin for VTE prevention d/t cardiomyopathy.  Patient stated at home she has 5mg  and 1 mg tablets of coumadin-- 2.5 mg (half of 5mg  tablet) daily  Except 3.5mg  (2.5mg  + 1mg ) on Sundays.  Per patient, last dose taken PTA was on Sunday 10/20.  INR is therapeutic at 2.79 today.  Patient stated that she has been on coumadin for years and is familiar with it.  No additional questions at this time.   Goal of Therapy:  INR 2-3    Plan:  1) resume home coumadin regimen of 2.5mg  daily except 3.5mg  on Sundays 2) f/u with AM INR and adjust dose as needed  Jidenna Figgs P 05/20/2012,1:22 PM

## 2012-05-20 NOTE — Progress Notes (Signed)
EEG completed once. Order for EEG was duplicated

## 2012-05-20 NOTE — Progress Notes (Signed)
[facility] PROGRESS NOTE  Anna Salazar:[id number] DOB: 07/13/1941 DOA: 05/19/2012 PCP: [name], MD  Assessment/Plan: 1. Loss of consciousness was transient left hemiparesis post ictally: Likely recurrent seizures and less likely to be TIA. Neurology consultation appreciated. MRI of brain negative for stroke. EEG done and report pending. Patient has been started on Keppra. Discussed with patient's primary cardiologist Dr. [name] reviewed his office records and indicated that she had been on Coumadin for viral myocarditis since 2006. However 2-D echo 6 months ago showed EF of 60%. He recommended no further need for Coumadin.  2. Recurrent falls: We'll get PT and OT to evaluate. Coumadin discontinued. 3. Hypertension: Controlled. 4. Hyperlipidemia 5. History of cardiomyopathy: Please see discussion above. Patient's daughter was concerned regarding discontinuing Coumadin. Advised her that cardiologist does not have an indication for it to be continued. She requested we discussed with her PCP in a.m. see if there was any other indications. Discussed with her that even if she had indications for anticoagulation she would be at high risk for catastrophic bleeding given her risk of falls. She was agreeable to holding Coumadin at this time. Continue aspirin. 6. Chronic anemia: Stable. 7. Chronic kidney disease, stage II: Creatinine probably at baseline. Follow BMP in a.m.  Code Status: Full  Family Communication: Discussed with patient's daughter, 2 sons and spouse at the bedside.  Disposition Plan: Home   Consultants:  Neurology  Procedures:  MRI brain  EEG  Antibiotics:  None  HPI/Subjective: Patient denies complaints. Per patient and family patient is back to baseline. Resolved left-sided weakness.   Objective: Filed Vitals:   05/20/12 0042 05/20/12 0615 05/20/12 1000 05/20/12 1400  BP: 107/36 107/44 103/48 113/60  Pulse: 68 63 62 64  Temp: 97.7 F (36.5 C)  97.9 F (36.6 C) 98.4 F (36.9 C) 98 F (36.7 C)  TempSrc: Oral Oral Oral Oral  Resp: 20 18 18 18   Height: 5\' 2"  (1.575 m)     Weight: 65.6 kg (144 lb 10 oz)     SpO2: 98% 98% 99% 99%    Intake/Output Summary (Last 24 hours) at 05/20/12 1803 Last data filed at 05/20/12 0800  Gross per 24 hour  Intake    240 ml  Output      0 ml  Net    240 ml   Filed Weights   05/20/12 0042  Weight: 65.6 kg (144 lb 10 oz)    Exam:   General: Sitting up comfortably in bed.  Respiratory system: Clear to auscultation.  Cardiovascular system: S1 and S2 heard, regular rate and rhythm. Telemetry shows sinus bradycardia with first degree AV block.  Gastrointestinal system: Abdomen is nondistended, soft and nontender. Normal bowel sounds heard.  Central nervous system: Alert and oriented. No focal neurological deficits.  Extremities: Symmetric 5 x 5 power.  Data Reviewed: Basic Metabolic Panel:  Lab 05/20/12 4010 05/19/12 2055  NA 142 141  K 3.9 4.1  CL 109 105  CO2 28 28  GLUCOSE 88 104*  BUN 20 23  CREATININE 1.51* 1.63*  CALCIUM 8.7 9.6  MG -- --  PHOS -- --   Liver Function Tests:  Lab 05/20/12 0630 05/19/12 2055  AST 16 18  ALT 7 9  ALKPHOS 30* 37*  BILITOT 0.4 0.3  PROT 5.3* 6.0  ALBUMIN 2.9* 3.3*   No results found for this basename: LIPASE:5,AMYLASE:5 in the last 168 hours No results found for this basename: AMMONIA:5 in the last 168 hours CBC:  Lab  05/20/12 0630 05/19/12 2055  WBC 7.6 8.3  NEUTROABS 2.8 3.2  HGB 11.2* 12.2  HCT 34.1* 36.3  MCV 88.3 86.6  PLT 224 247   Cardiac Enzymes:  Lab 05/19/12 2055  CKTOTAL --  CKMB --  CKMBINDEX --  TROPONINI <0.30   BNP (last 3 results) No results found for this basename: PROBNP:3 in the last 8760 hours CBG:  Lab 05/20/12 0119  GLUCAP 94   Other labs  Lipid panel: Cholesterol 105, triglycerides 73, HDL 50, LDL 14 VLDL 15   No results found for this or any previous visit (from the past 240 hour(s)).     Studies: Ct Head Wo Contrast  05/19/2012  *RADIOLOGY REPORT*  Clinical Data: Fall.  Left-sided weakness.  CT HEAD WITHOUT CONTRAST  Technique:  Contiguous axial images were obtained from the base of the skull through the vertex without contrast.  Comparison: 10/29/2005  Findings: There is mild central cortical atrophy.  Mild white matter changes with small vessel disease.  There is no evidence for hemorrhage, mass lesion, or acute infarction.  Bone windows show atherosclerotic calcification of the internal carotid arteries and no fracture.  Visualized paranasal sinuses and mastoid air cells are normally aerated.  IMPRESSION:  1.  Atrophy and small vessel disease. 2. No evidence for acute  abnormality.  Critical test results telephoned to Lara Mulch at the time of interpretation on date 05/19/2012 at time 9:06 p.m.   Original Report Authenticated By: Patterson Hammersmith, M.D.    Mri Brain Without Contrast  05/20/2012  *RADIOLOGY REPORT*  Clinical Data: Syncope.  Possible seizure or TIA.  MRI HEAD WITHOUT CONTRAST  Technique:  Multiplanar, multiecho pulse sequences of the brain and surrounding structures were obtained according to standard protocol without intravenous contrast.  Comparison: CT 05/19/2012  Findings: Negative for acute infarct.  Generalized atrophy.  Mild chronic microvascular ischemic changes in the white matter.  Chronic infarct left thalamus.  Brainstem and cerebellum are intact.  Negative for intracranial hemorrhage or fluid collection.  Negative for mass or edema.  Paranasal sinuses are clear.  IMPRESSION: Atrophy and mild chronic ischemic change.  No acute abnormality.   Original Report Authenticated By: Camelia Phenes, M.D.     Scheduled Meds:    . aspirin EC  81 mg Oral Daily  . levETIRAcetam  250 mg Oral BID  . pneumococcal 23 valent vaccine  0.5 mL Intramuscular Tomorrow-1000  . sodium chloride  500 mL Intravenous Once  . DISCONTD: warfarin  2.5 mg Oral Custom  .  DISCONTD: warfarin  3.5 mg Oral Q Sun-1800  . DISCONTD: Warfarin - Pharmacist Dosing Inpatient   Does not apply q1800   Continuous Infusions:    . sodium chloride      Principal Problem:  *Syncope Active Problems:  Left-sided weakness  HTN (hypertension)  H/O tonic-clonic seizures  Hyperlipidemia     [facility]  [facility] Pager [phone]. If 8PM-8AM, please contact night-coverage at [email or web address], password [facility] 05/20/2012, 6:03 PM  LOS: 1 day

## 2012-05-20 NOTE — Progress Notes (Signed)
Pt's grand-daughter had multiple questions re: Pt's current POC.  She wished to addressed several issues, including her change in Coumadin, with the MD.  Several attempts made to assure her that due to the fact that this patient had had multiple falls in the past, that it was logical to question whether the risks would outweigh the benefit with this therapy.  The patient made it clear that she would be discussing this with her PCP on Thursday and she would abide by what he/she said.  Dr.  Waymon Amato discussed the aforementioned with the grand-daughter at length.  Progress note to follow.

## 2012-05-21 DIAGNOSIS — N182 Chronic kidney disease, stage 2 (mild): Secondary | ICD-10-CM

## 2012-05-21 LAB — CBC
HCT: 34.2 % — ABNORMAL LOW (ref 36.0–46.0)
MCHC: 33.3 g/dL (ref 30.0–36.0)
MCV: 88.1 fL (ref 78.0–100.0)
Platelets: 221 10*3/uL (ref 150–400)
RDW: 14.2 % (ref 11.5–15.5)

## 2012-05-21 LAB — BASIC METABOLIC PANEL
BUN: 17 mg/dL (ref 6–23)
Creatinine, Ser: 1.32 mg/dL — ABNORMAL HIGH (ref 0.50–1.10)
GFR calc Af Amer: 45 mL/min — ABNORMAL LOW (ref >90)
GFR calc non Af Amer: 39 mL/min — ABNORMAL LOW (ref >90)
Potassium: 4.3 mEq/L (ref 3.5–5.1)

## 2012-05-21 MED ORDER — LEVETIRACETAM 250 MG PO TABS: 250.0000 mg | ORAL_TABLET | Freq: Two times a day (BID) | ORAL | Status: DC

## 2012-05-21 MED ORDER — ASPIRIN EC 325 MG PO TBEC: 325.0000 mg | DELAYED_RELEASE_TABLET | Freq: Every day | ORAL | Status: DC

## 2012-05-21 NOTE — Progress Notes (Signed)
05/21/12 Pt education and discharge teaching complete. Pt set up with Home Health PT via Case Manager. Pt stable and happy to be going home. Dc'd home with family.

## 2012-05-21 NOTE — Care Management Note (Signed)
    Page 1 of 1   05/21/2012     12:52:25 PM   CARE MANAGEMENT NOTE 05/21/2012  Patient:  Anna Salazar, Anna Salazar   Account Number:  [id number]  Date Initiated:  05/21/2012  Documentation initiated by:  [facility]  Subjective/Objective Assessment:   Admitted with left sided weakness.     Action/Plan:   PT/OT eval recommending HHPT, HHOT   Anticipated DC Date:  05/21/2012   Anticipated DC Plan:  HOME W HOME HEALTH SERVICES      DC Planning Services  CM consult      Choice offered to / List presented to:  C-1 Patient        HH arranged  HH-2 PT      [facility] agency  [facility]   Status of service:  Completed, signed off Medicare Important Message given?   (If response is "NO", the following Medicare IM given date fields will be blank) Date Medicare IM given:   Date Additional Medicare IM given:    Discharge Disposition:  HOME W HOME HEALTH SERVICES  Per UR Regulation:  Reviewed for med. necessity/level of care/duration of stay  If discussed at Long Length of Stay Meetings, dates discussed:    Comments:  05/21/12 Spoke with patient about HHC for HHPT. She chose [facility] from the [facility] list. Dava Najjar at [facility] and requested HHPT, HHOT not ordered.Gave patient phone # for [facility].  No equipment needs identified. Jacquelynn Cree RN, BSN, CCM

## 2012-05-21 NOTE — Procedures (Signed)
EEG NUMBER:  [id number].  REFERRING PHYSICIAN:  [name], MD.  INDICATION FOR STUDY:  A 71 year old lady who has been experiencing recurrent episodes of altered consciousness, who also presented with left-sided numbness and weakness associated with an episode of altered consciousness.  Study is being performed to rule out possible seizure disorder.  TECHNIQUE:  This is a routine EEG recording performed during wakefulness.  Predominant background activity consisted of 9 Hz symmetrical low-amplitude alpha activity recorded from the posterior head regions as well as diffuse low-amplitude 20-25 Hz beta activity, which was most prominent in the frontal and central regions.  Photic stimulation produced a symmetrical occipital driving response. Hyperventilation was not performed.  No epileptiform discharges were recorded.  INTERPRETATION:  This is a normal EEG recording during wakefulness.     Noel Christmas, MD    LN:LGXQ D:  05/20/2012 13:41:17  T:  05/21/2012 05:34:59  Job #:  [id number]

## 2012-05-21 NOTE — Evaluation (Signed)
Occupational Therapy Evaluation Patient Details Name: Anna Salazar MRN: [id number] DOB: [birth year] Today's Date: 05/21/2012 Time: 4098-1191 OT Time Calculation (min): 23 min  OT Assessment / Plan / Recommendation Clinical Impression  Pt was admitted with recurrent episode of altered consciousness, L side numbness and weakness which has resolved.  Pt and her family agree that pt is at her baseline.  Pt admits that she falls frequently.  Demonstrates decreased balance.  Instructed in home safety.  Recommend HHOT to address home safety with ADL/IADL.    OT Assessment  All further OT needs can be met in the next venue of care    Follow Up Recommendations  Home health OT    Barriers to Discharge      Equipment Recommendations  None recommended by OT    Recommendations for Other Services    Frequency       Precautions / Restrictions Precautions Precautions: Fall Restrictions Weight Bearing Restrictions: No   Pertinent Vitals/Pain No pain    ADL  Eating/Feeding: Simulated;Independent Where Assessed - Eating/Feeding: Edge of bed Grooming: Performed;Wash/dry hands;Supervision/safety Where Assessed - Grooming: Unsupported standing Upper Body Bathing: Simulated;Set up Where Assessed - Upper Body Bathing: Unsupported sitting Lower Body Bathing: Simulated;Supervision/safety Where Assessed - Lower Body Bathing: Supported sit to stand Upper Body Dressing: Performed;Set up Where Assessed - Upper Body Dressing: Unsupported sitting Lower Body Dressing: Performed;Supervision/safety Where Assessed - Lower Body Dressing: Supported sit to stand Toilet Transfer: Research scientist (life sciences) Method: Sit to Barista: Comfort height toilet;Grab bars Toileting - Architect and Hygiene: Performed;Modified independent Where Assessed - Toileting Clothing Manipulation and Hygiene: Sit to stand from 3-in-1 or toilet Equipment Used: Gait  belt;Cane Transfers/Ambulation Related to ADLs: supervision with cane, pt also holds furniture as she walks, this is typical for her    OT Diagnosis: Generalized weakness  OT Problem List: Impaired balance (sitting and/or standing) OT Treatment Interventions:     OT Goals    Visit Information  Last OT Received On: 05/21/12 Assistance Needed: +1    Subjective Data  Subjective: "I don't want to bother my family." Patient Stated Goal: Return home with assist of family.   Prior Functioning     Home Living Lives With: Spouse Available Help at Discharge: Family;Available 24 hours/day Type of Home: Mobile home Home Access: Stairs to enter Entrance Stairs-Number of Steps: 1 Entrance Stairs-Rails: Right;Left;Can reach both Home Layout: One level Bathroom Shower/Tub: Forensic scientist: Standard Home Adaptive Equipment: Paediatric nurse with back;Straight cane;Hand-held shower hose;Grab bars in shower Prior Function Level of Independence: Independent with assistive device(s) Able to Take Stairs?: Yes Driving: Yes Vocation: Retired Musician: No difficulties Dominant Hand: Right         Vision/Perception     Cognition  Overall Cognitive Status: Appears within functional limits for tasks assessed/performed Arousal/Alertness: Awake/alert Orientation Level: Appears intact for tasks assessed Behavior During Session: [facility] for tasks performed    Extremity/Trunk Assessment Right Upper Extremity Assessment RUE ROM/Strength/Tone: Within functional levels RUE Sensation: WFL - Light Touch;WFL - Proprioception RUE Coordination: WFL - gross/fine motor Left Upper Extremity Assessment LUE ROM/Strength/Tone: Within functional levels LUE Sensation: WFL - Light Touch;WFL - Proprioception LUE Coordination: WFL - gross/fine motor     Mobility Bed Mobility Bed Mobility: Supine to Sit;Sitting - Scoot to Edge of Bed Supine to Sit: 7:  Independent Sitting - Scoot to Edge of Bed: 7: Independent Transfers Transfers: Sit to Stand;Stand to Sit Sit to Stand: 6: Modified independent (Device/Increase time);From  bed;From toilet Stand to Sit: 6: Modified independent (Device/Increase time);To bed;To toilet     Shoulder Instructions     Exercise     Balance Balance Balance Assessed: Yes Dynamic Sitting Balance Dynamic Sitting - Balance Support: Feet supported Dynamic Sitting - Level of Assistance: 7: Independent Static Standing Balance Static Standing - Balance Support: No upper extremity supported Static Standing - Level of Assistance: 6: Modified independent (Device/Increase time)   End of Session OT - End of Session Activity Tolerance: Patient tolerated treatment well Patient left: in bed;with family/visitor present;with call bell/phone within reach;Other (comment) (MD in room)  GO     Evern Bio 05/21/2012, 12:15 PM [phone]

## 2012-05-21 NOTE — Discharge Summary (Signed)
Physician Discharge Summary  Anna Salazar:[id number] DOB: January 08, 1941 DOA: 05/19/2012  PCP: [name], MD  Admit date: 05/19/2012 Discharge date: 05/21/2012  Time spent: 40 minutes  Recommendations for Outpatient Follow-up:  1. Follow up with primary MD.  2. Follow up with Dr [name], neurologist.    Discharge Diagnoses:  Principal Problem:  *Syncope Active Problems:  Left-sided weakness  HTN (hypertension)  H/O tonic-clonic seizures  Hyperlipidemia  Seizure  Falls frequently  Anemia  Chronic kidney disease (CKD), stage II (mild)   Discharge Condition: Satisfactory.   Diet recommendation: Heart-Healthy.   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 Her family members noticed a sound and she was found on the floor in the bathroom. After the episode she gained consciousness spontaneously and had residual left-sided weakness and numbness. The weakness lasted for around half an hour and resolved spontaneously but the numbness persisted. Code stroke was called, but then cancelled following neurology evaluation. Patient does have a history of seizures and the last time she had taken antiseizure medication was more than 10 years ago. She was admitted for further evaluation and management.   Hospital Course:  1. Syncope: patient presented following a brief episode of loss of consciousness, followed by transient left hemiparesis. Neurology consultation was provided by Dr [name]. She remained in SR on telemetric monitoring, MRI of brain negative for stroke. EEG was a normal study, and no further episodes were documented during hospitalization. Likely, patient had a  seizure episode, complicated by Todd's palsy. Per neurology recommendations. She will follow up with neurologist, on discharge. 2. Recurrent falls: PT and OT have evaluated patient. She will likely benefit from HHPT. Anticoagulation places patient at risk for bleed, should further falls occur. Dr [name] discussed with patient's primary cardiologist Dr. [name], who reviewed his office records and indicated that she had been on Coumadin for viral myocarditis since 2006. However 2-D echo 6 months ago showed EF of 60%. He recommended no further need for Coumadin. I did discuss with Dr [name], patient's PMD in AM of 05/21/12, on the same subject. He confirms that Coumadin was actually started for TIAs. He concurs with discontinuation of anticoagulation. Family has been updated accordingly. Patient has been placed on ASA.  3. Hypertension: BP remained controlled, during this hospitalization.  4. Hyperlipidemia: Patient's lipid profile is excellent. She has been reassured accordingly.  5. History of cardiomyopathy: Please see discussion in #2 above. Patient demonstrated no clinical features of CHF, during her hospitalization.  6. Chronic anemia: Stable. 7. Chronic kidney disease, stage II: Creatinine probably at baseline. Creatinine was 1.32 on 05/21/12.    Procedures:  See below.   Consultations:  Dr [name], neurologist.   Discharge Exam: Filed Vitals:   05/21/12 0204 05/21/12 0615 05/21/12 0625 05/21/12 1124  BP: 112/40 110/35 108/50 117/46  Pulse: 71 73  65  Temp: 97.8 F (36.6 C) 97.5 F (36.4 C)  97.9 F (36.6 C)  TempSrc: Oral Oral  Oral  Resp: 18 16  18   Height:      Weight:      SpO2: 97% 96%  99%    General: Comfortable, alert, communicative, fully oriented,  not short of breath at rest.  HEENT:  No clinical pallor, no jaundice, no conjunctival injection or discharge. Hydration is fair.  NECK:  Supple, JVP not seen, no carotid bruits, no palpable  lymphadenopathy, no palpable goiter. CHEST:  Clinically clear to auscultation, no wheezes, no crackles. HEART:  Sounds 1 and 2 heard, normal, regular, no murmurs. ABDOMEN:  Full, soft, non-tender, no palpable organomegaly, no palpable masses, normal bowel sounds. GENITALIA:  Not examined. LOWER EXTREMITIES:  No pitting edema, palpable peripheral pulses. MUSCULOSKELETAL SYSTEM:  Generalized osteoarthritic changes, otherwise, normal. CENTRAL NERVOUS SYSTEM:  No focal neurologic deficit on gross examination.  Discharge Instructions      Discharge Orders    Future Orders Please Complete By Expires   Diet - low sodium heart healthy      Home Health      Questions: Responses:   To provide the following care/treatments PT   Face-to-face encounter      Comments:   I Anna Salazar,CHRISTOPHER certify that this patient is under my care and that I, or a nurse practitioner or physician's assistant working with me, had a face-to-face encounter that meets the physician face-to-face encounter requirements with this patient on 05/21/2012.   Questions: Responses:   The encounter with the patient was in whole, or in part, for the following medical condition, which is the primary reason for home health care Seizure disorder, falls.   I certify that, based on my findings, the following services are medically necessary home health services Physical therapy   My clinical findings support the need for the above services Complex treatment plan/patient with lack knowledge disease process and treatment   Further, I certify that my clinical findings support that this patient is homebound due to: Unsafe ambulation due to balance issues   To provide the following care/treatments PT   Increase activity slowly          Medication List     As of 05/21/2012 11:53 AM    STOP taking these medications         aspirin 81 MG tablet      warfarin 1 MG tablet   Commonly known as: COUMADIN      TAKE these medications          acetaminophen 325 MG tablet   Commonly known as: TYLENOL   Take 650 mg by mouth every 6 (six) hours as needed. For headaches      amLODipine 2.5 MG tablet   Commonly known as: NORVASC   Take 2.5 mg by mouth daily.      aspirin EC 325 MG tablet   Take 1 tablet (325 mg total) by mouth daily.      atorvastatin 40 MG tablet   Commonly known as: LIPITOR   Take 40 mg by mouth daily.      CALCIUM-VITAMIN D PO   Take 2 tablets by mouth daily.      carvedilol 6.25 MG tablet   Commonly known as: COREG   Take 12.5 mg by mouth 2 (two) times daily with a meal.      citalopram 40 MG tablet   Commonly known as: CELEXA   Take 40 mg by mouth daily.      clonazePAM 0.5 MG tablet   Commonly known as: KLONOPIN   Take 1 mg by mouth at bedtime.      digoxin 0.125 MG tablet   Commonly known as: LANOXIN   Take 0.125 mg by mouth daily.  fenofibrate 160 MG tablet   Take 160 mg by mouth daily.      furosemide 40 MG tablet   Commonly known as: LASIX   Take 40 mg by mouth daily.      levETIRAcetam 250 MG tablet   Commonly known as: KEPPRA   Take 1 tablet (250 mg total) by mouth 2 (two) times daily.      lisinopril 10 MG tablet   Commonly known as: PRINIVIL,ZESTRIL   Take 10 mg by mouth daily.      potassium chloride 20 MEQ packet   Commonly known as: KLOR-CON   Take 20 mEq by mouth 2 (two) times daily.      raloxifene 60 MG tablet   Commonly known as: EVISTA   Take 60 mg by mouth daily.        Follow-up Information    Follow up with [facility], MD. In 1 week.   Contact information:   [address] [place] [address] [phone]       Follow up with Gates Rigg, MD. Schedule an appointment as soon as possible for a visit in 1 month.   Contact information:   [address], [address] [facility] [place] [address] [phone]           The results of significant diagnostics from this hospitalization (including imaging,  microbiology, ancillary and laboratory) are listed below for reference.    Significant Diagnostic Studies: Ct Head Wo Contrast  05/19/2012  *RADIOLOGY REPORT*  Clinical Data: Fall.  Left-sided weakness.  CT HEAD WITHOUT CONTRAST  Technique:  Contiguous axial images were obtained from the base of the skull through the vertex without contrast.  Comparison: 10/29/2005  Findings: There is mild central cortical atrophy.  Mild white matter changes with small vessel disease.  There is no evidence for hemorrhage, mass lesion, or acute infarction.  Bone windows show atherosclerotic calcification of the internal carotid arteries and no fracture.  Visualized paranasal sinuses and mastoid air cells are normally aerated.  IMPRESSION:  1.  Atrophy and small vessel disease. 2. No evidence for acute  abnormality.  Critical test results telephoned to Lara Mulch at the time of interpretation on date 05/19/2012 at time 9:06 p.m.   Original Report Authenticated By: Patterson Hammersmith, M.D.    Mri Brain Without Contrast  05/20/2012  *RADIOLOGY REPORT*  Clinical Data: Syncope.  Possible seizure or TIA.  MRI HEAD WITHOUT CONTRAST  Technique:  Multiplanar, multiecho pulse sequences of the brain and surrounding structures were obtained according to standard protocol without intravenous contrast.  Comparison: CT 05/19/2012  Findings: Negative for acute infarct.  Generalized atrophy.  Mild chronic microvascular ischemic changes in the white matter.  Chronic infarct left thalamus.  Brainstem and cerebellum are intact.  Negative for intracranial hemorrhage or fluid collection.  Negative for mass or edema.  Paranasal sinuses are clear.  IMPRESSION: Atrophy and mild chronic ischemic change.  No acute abnormality.   Original Report Authenticated By: Camelia Phenes, M.D.     Microbiology: No results found for this or any previous visit (from the past 240 hour(s)).   Labs: Basic Metabolic Panel:  Lab 05/21/12 5784 05/20/12 0630  05/19/12 2055  NA 147* 142 141  K 4.3 3.9 4.1  CL 114* 109 105  CO2 26 28 28   GLUCOSE 83 88 104*  BUN 17 20 23   CREATININE 1.32* 1.51* 1.63*  CALCIUM 8.8 8.7 9.6  MG -- -- --  PHOS -- -- --  Liver Function Tests:  Lab 05/20/12 0630 05/19/12 2055  AST 16 18  ALT 7 9  ALKPHOS 30* 37*  BILITOT 0.4 0.3  PROT 5.3* 6.0  ALBUMIN 2.9* 3.3*   No results found for this basename: LIPASE:5,AMYLASE:5 in the last 168 hours No results found for this basename: AMMONIA:5 in the last 168 hours CBC:  Lab 05/21/12 0500 05/20/12 0630 05/19/12 2055  WBC 7.4 7.6 8.3  NEUTROABS -- 2.8 3.2  HGB 11.4* 11.2* 12.2  HCT 34.2* 34.1* 36.3  MCV 88.1 88.3 86.6  PLT 221 224 247   Cardiac Enzymes:  Lab 05/19/12 2055  CKTOTAL --  CKMB --  CKMBINDEX --  TROPONINI <0.30   BNP: BNP (last 3 results) No results found for this basename: PROBNP:3 in the last 8760 hours CBG:  Lab 05/20/12 0119  GLUCAP 94       Signed:  Annisha Baar,CHRISTOPHER  [facility] 05/21/2012, 11:53 AM

## 2012-05-21 NOTE — Progress Notes (Signed)
PT Cancellation Note  Patient Details Name: LUCILE COCH MRN: [id number] DOB: [birth year]   Cancelled Evaluation:    Reason Eval/Treat Not Completed: Other (MD preparing pt for discharge and pt eating lunch). Prior to arrival had discussed with Martie Round, OT pt's situation. Pt reports previous falls have been due to becoming off-balance and agrees to HHPT and OT. MD present and agreed to order.   Krystyna Cleckley 05/21/2012, 12:03 PM Pager [phone]

## 2013-01-01 DIAGNOSIS — S329XXA Fracture of unspecified parts of lumbosacral spine and pelvis, initial encounter for closed fracture: Secondary | ICD-10-CM | POA: Insufficient documentation

## 2013-01-05 DIAGNOSIS — I5032 Chronic diastolic (congestive) heart failure: Secondary | ICD-10-CM | POA: Insufficient documentation

## 2013-01-05 DIAGNOSIS — G3184 Mild cognitive impairment, so stated: Secondary | ICD-10-CM | POA: Insufficient documentation

## 2013-01-19 DIAGNOSIS — M810 Age-related osteoporosis without current pathological fracture: Secondary | ICD-10-CM | POA: Insufficient documentation

## 2013-01-19 DIAGNOSIS — Z9181 History of falling: Secondary | ICD-10-CM | POA: Insufficient documentation

## 2013-01-19 DIAGNOSIS — E559 Vitamin D deficiency, unspecified: Secondary | ICD-10-CM | POA: Insufficient documentation

## 2019-12-16 ENCOUNTER — Telehealth: Payer: Self-pay

## 2020-01-01 NOTE — Progress Notes (Signed)
PCP - [name]  Clearance 12-22-19 epic Cardiologist - Dr. Newt Lukes waiting on clearance  PPM/ICD -  Device Orders -  Rep Notified -   Chest x-ray -  EKG - 01-05-20 epic Stress Test -  ECHO -  Cardiac Cath -   Sleep Study -  CPAP -   Fasting Blood Sugar -  Checks Blood Sugar _____ times a day  Blood Thinner Instructions:Plavix and asa 81mg  last dose 01-06-20 Aspirin Instructions:  ERAS Protcol - PRE-SURGERY Ensure   COVID TEST- 01-08-20   Anesthesia review: Hx LBBB on 2013 ekg, LBBB on 01-05-20 ekg  Patient denies shortness of breath, fever, cough and chest pain at PAT appointment   none    All instructions explained to the patient, with a verbal understanding of the material. Patient agrees to go over the instructions while at home for a better understanding. Patient also instructed to self quarantine after being tested for COVID-19. The opportunity to ask questions was provided.

## 2020-01-01 NOTE — Patient Instructions (Addendum)
DUE TO COVID-19 ONLY ONE VISITOR IS ALLOWED TO COME WITH YOU AND STAY IN THE WAITING ROOM ONLY DURING PRE OP AND PROCEDURE DAY OF SURGERY. TWO  VISITOR MAY VISIT WITH YOU AFTER SURGERY IN YOUR PRIVATE ROOM DURING VISITING HOURS ONLY!  10a-8p  YOU NEED TO HAVE A COVID 19 TEST ON__6-11-21_____ @_10 :00______, THIS TEST MUST BE DONE BEFORE SURGERY, COME  [address], [place] , .  [facility]) ONCE YOUR COVID TEST IS COMPLETED, PLEASE BEGIN THE QUARANTINE INSTRUCTIONS AS OUTLINED IN YOUR HANDOUT.                NAMRATA DANGLER  01/01/2020   Your procedure is scheduled on: 01-12-20   Report to [facility] Main  Entrance   Report to short stay  at     0530  AM     Call this number if you have problems the morning of surgery [phone]    Remember: NO SOLID FOOD AFTER MIDNIGHT THE NIGHT PRIOR TO SURGERY. NOTHING BY MOUTH EXCEPT CLEAR LIQUIDS UNTIL    0415 am . PLEASE FINISH ENSURE DRINK PER SURGEON ORDER  WHICH NEEDS TO BE COMPLETED AT     0415 am then nothing by mouth .  BRUSH YOUR TEETH MORNING OF SURGERY AND RINSE YOUR MOUTH OUT, NO CHEWING GUM CANDY OR MINTS.     Take these medicines the morning of surgery with A SIP OF WATER: fenofibrate, klonopin  If needed, lipitor, carvedilol                                 You may not have any metal on your body including hair pins and              piercings  Do not wear jewelry, make-up, lotions, powders or perfumes, deodorant             Do not wear nail polish on your fingernails.  Do not shave  48 hours prior to surgery.     Do not bring valuables to the hospital. [facility] IS NOT             RESPONSIBLE   FOR VALUABLES.  Contacts, dentures or bridgework may not be worn into surgery.      Patients discharged the day of surgery will not be allowed to drive home. IF YOU ARE HAVING SURGERY AND GOING HOME THE SAME DAY, YOU MUST HAVE AN ADULT TO DRIVE YOU HOME AND BE WITH YOU FOR 24 HOURS. YOU MAY GO  HOME BY TAXI OR UBER OR ORTHERWISE, BUT AN ADULT MUST ACCOMPANY YOU HOME AND STAY WITH YOU FOR 24 HOURS.  Name and phone number of your driver:  Special Instructions: N/A              Please read over the following fact sheets you were given: _____________________________________________________________________             [facility] - Preparing for Surgery Before surgery, you can play an important role.  Because skin is not sterile, your skin needs to be as free of germs as possible.  You can reduce the number of germs on your skin by washing with CHG (chlorahexidine gluconate) soap before surgery.  CHG is an antiseptic cleaner which kills germs and bonds with the skin to continue killing germs even after washing. Please DO NOT use if you have an allergy to CHG  or antibacterial soaps.  If your skin becomes reddened/irritated stop using the CHG and inform your nurse when you arrive at Short Stay. Do not shave (including legs and underarms) for at least 48 hours prior to the first CHG shower.  You may shave your face/neck. Please follow these instructions carefully:  1.  Shower with CHG Soap the night before surgery and the  morning of Surgery.  2.  If you choose to wash your hair, wash your hair first as usual with your  normal  shampoo.  3.  After you shampoo, rinse your hair and body thoroughly to remove the  shampoo.                           4.  Use CHG as you would any other liquid soap.  You can apply chg directly  to the skin and wash                       Gently with a scrungie or clean washcloth.  5.  Apply the CHG Soap to your body ONLY FROM THE NECK DOWN.   Do not use on face/ open                           Wound or open sores. Avoid contact with eyes, ears mouth and genitals (private parts).                       Wash face,  Genitals (private parts) with your normal soap.             6.  Wash thoroughly, paying special attention to the area where your surgery  will be  performed.  7.  Thoroughly rinse your body with warm water from the neck down.  8.  DO NOT shower/wash with your normal soap after using and rinsing off  the CHG Soap.                9.  Pat yourself dry with a clean towel.            10.  Wear clean pajamas.            11.  Place clean sheets on your bed the night of your first shower and do not  sleep with pets. Day of Surgery : Do not apply any lotions/deodorants the morning of surgery.  Please wear clean clothes to the hospital/surgery center.  FAILURE TO FOLLOW THESE INSTRUCTIONS MAY RESULT IN THE CANCELLATION OF YOUR SURGERY PATIENT SIGNATURE_________________________________  NURSE SIGNATURE__________________________________  ________________________________________________________________________   Rogelia Mire  An incentive spirometer is a tool that can help keep your lungs clear and active. This tool measures how well you are filling your lungs with each breath. Taking long deep breaths may help reverse or decrease the chance of developing breathing (pulmonary) problems (especially infection) following:  A long period of time when you are unable to move or be active. BEFORE THE PROCEDURE   If the spirometer includes an indicator to show your best effort, your nurse or respiratory therapist will set it to a desired goal.  If possible, sit up straight or lean slightly forward. Try not to slouch.  Hold the incentive spirometer in an upright position. INSTRUCTIONS FOR USE  1. Sit on the edge of your bed if possible, or sit up as far as you can in  bed or on a chair. 2. Hold the incentive spirometer in an upright position. 3. Breathe out normally. 4. Place the mouthpiece in your mouth and seal your lips tightly around it. 5. Breathe in slowly and as deeply as possible, raising the piston or the ball toward the top of the column. 6. Hold your breath for 3-5 seconds or for as long as possible. Allow the piston or ball to  fall to the bottom of the column. 7. Remove the mouthpiece from your mouth and breathe out normally. 8. Rest for a few seconds and repeat Steps 1 through 7 at least 10 times every 1-2 hours when you are awake. Take your time and take a few normal breaths between deep breaths. 9. The spirometer may include an indicator to show your best effort. Use the indicator as a goal to work toward during each repetition. 10. After each set of 10 deep breaths, practice coughing to be sure your lungs are clear. If you have an incision (the cut made at the time of surgery), support your incision when coughing by placing a pillow or rolled up towels firmly against it. Once you are able to get out of bed, walk around indoors and cough well. You may stop using the incentive spirometer when instructed by your caregiver.  RISKS AND COMPLICATIONS  Take your time so you do not get dizzy or light-headed.  If you are in pain, you may need to take or ask for pain medication before doing incentive spirometry. It is harder to take a deep breath if you are having pain. AFTER USE  Rest and breathe slowly and easily.  It can be helpful to keep track of a log of your progress. Your caregiver can provide you with a simple table to help with this. If you are using the spirometer at home, follow these instructions: [place] IF:   You are having difficultly using the spirometer.  You have trouble using the spirometer as often as instructed.  Your pain medication is not giving enough relief while using the spirometer.  You develop fever of 100.5 F (38.1 C) or higher. SEEK IMMEDIATE MEDICAL CARE IF:   You cough up bloody sputum that had not been present before.  You develop fever of 102 F (38.9 C) or greater.  You develop worsening pain at or near the incision site. MAKE SURE YOU:   Understand these instructions.  Will watch your condition.  Will get help right away if you are not doing well or get  worse. Document Released: 11/26/2006 Document Revised: 10/08/2011 Document Reviewed: 01/27/2007 [facility] Patient Information 2014 [place], [place].   ________________________________________________________________________

## 2020-01-04 ENCOUNTER — Other Ambulatory Visit: Payer: Self-pay

## 2020-01-04 ENCOUNTER — Encounter (HOSPITAL_COMMUNITY)
Admission: RE | Admit: 2020-01-04 | Discharge: 2020-01-04 | Disposition: A | Discharge status: Home / Self Care | Payer: Medicare HMO | Source: Ambulatory Visit | Attending: Orthopedic Surgery | Admitting: Orthopedic Surgery

## 2020-01-04 ENCOUNTER — Encounter (HOSPITAL_COMMUNITY): Payer: Self-pay

## 2020-01-04 DIAGNOSIS — I447 Left bundle-branch block, unspecified: Secondary | ICD-10-CM | POA: Diagnosis not present

## 2020-01-04 DIAGNOSIS — R9431 Abnormal electrocardiogram [ECG] [EKG]: Secondary | ICD-10-CM | POA: Insufficient documentation

## 2020-01-04 DIAGNOSIS — Z01818 Encounter for other preprocedural examination: Secondary | ICD-10-CM | POA: Insufficient documentation

## 2020-01-04 HISTORY — DX: Headache, unspecified: R51.9

## 2020-01-04 HISTORY — DX: Depression, unspecified: F32.A

## 2020-01-04 HISTORY — DX: Osteogenesis imperfecta: Q78.0

## 2020-01-04 HISTORY — DX: Unspecified osteoarthritis, unspecified site: M19.90

## 2020-01-04 NOTE — H&P (Signed)
TOTAL KNEE ADMISSION H&P  Patient is being admitted for right total knee arthroplasty.  Subjective:  Chief Complaint:   Right knee OA / pain  HPI: Anna Salazar, 79 y.o. female, has a history of pain and functional disability in the right knee due to arthritis and has failed non-surgical conservative treatments for greater than 12 weeks to include NSAID's and/or analgesics, corticosteriod injections, use of assistive devices and activity modification.  Onset of symptoms was gradual, starting 4 years ago with gradually worsening course since that time. The patient noted prior procedures on the knee to include  ORIF patella on the right knee(s).  Patient currently rates pain in the right knee(s) at 8 out of 10 with activity. Patient has worsening of pain with activity and weight bearing, pain that interferes with activities of daily living, pain with passive range of motion, crepitus and joint swelling.  Patient has evidence of periarticular osteophytes and joint space narrowing by imaging studies. There is no active infection.  Risks, benefits and expectations were discussed with the patient.  Risks including but not limited to the risk of anesthesia, blood clots, nerve damage, blood vessel damage, failure of the prosthesis, infection and up to and including death.  Patient understand the risks, benefits and expectations and wishes to proceed with surgery.   PCP: [name], MD  D/C Plans:       Home   Post-op Meds:       No Rx given   Tranexamic Acid:      To be given - IV   Decadron:      Is to be given  FYI:      Plavix & ASA  Norco  Fentanyl (No morphine)  DME:   Pt equipment arranged  PT:   OPPT arranged  Pharmacy: [facility] [place]    Patient Active Problem List   Diagnosis Date Noted  . Seizure (HCC) 05/20/2012  . Falls frequently 05/20/2012  . Anemia 05/20/2012  . 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 Syncope 05/19/2012  . Left-sided weakness  05/19/2012  . HTN (hypertension) 05/19/2012  . H/O tonic-clonic seizures 05/19/2012  . Hyperlipidemia 05/19/2012   Past Medical History:  Diagnosis Date  . Arthritis   . Brittle bones   . Cardiomyopathy (HCC)   . CHF (congestive heart failure) (HCC)   . Coronary artery disease   . Depression   . Headache   . Hyperlipidemia   . Hypertension   . Seizures (HCC)   . TIA (transient ischemic attack)     Past Surgical History:  Procedure Laterality Date  . ABDOMINAL HYSTERECTOMY    . APPENDECTOMY    . CARDIAC CATHETERIZATION    . CHOLECYSTECTOMY    . FRACTURE SURGERY     ankle right  . PATELLA FRACTURE SURGERY     1964    No current facility-administered medications for this encounter.   Current Outpatient Medications  Medication Sig Dispense Refill Last Dose  . acetaminophen (TYLENOL) 325 MG tablet Take 650 mg by mouth daily as needed for mild pain (headaches.).      Marland Kitchen aspirin EC 81 MG tablet Take 81 mg by mouth daily.     Marland Kitchen atorvastatin (LIPITOR) 40 MG tablet Take 40 mg by mouth daily.     Marland Kitchen BYSTOLIC 5 MG tablet Take 5 mg by mouth at bedtime.      . carvedilol (COREG) 6.25 MG tablet Take 6.25 mg by mouth 2 (two) times daily  with a meal.      . citalopram (CELEXA) 40 MG tablet Take 40 mg by mouth every evening.      . clonazePAM (KLONOPIN) 0.5 MG tablet Take 0.5 mg by mouth 2 (two) times daily.      . clopidogrel (PLAVIX) 75 MG tablet Take 75 mg by mouth daily.     . fenofibrate 160 MG tablet Take 160 mg by mouth daily.     . furosemide (LASIX) 40 MG tablet Take 40 mg by mouth daily.     Marland Kitchen KLOR-CON M20 20 MEQ tablet Take 20 mEq by mouth every evening.     . Vitamin D3 (VITAMIN D) 25 MCG tablet Take 1,000 Units by mouth daily.      Allergies  Allergen Reactions  . Tegretol [Carbamazepine] Hives    Social History   Tobacco Use  . Smoking status: Former Games developer  . Smokeless tobacco: Never Used  . Tobacco comment: smoked for 6 months only   Substance Use Topics  .  Alcohol use: No      Review of Systems  Constitutional: Negative.   HENT: Negative.   Eyes: Negative.   Respiratory: Negative.   Cardiovascular: Negative.   Gastrointestinal: Negative.   Genitourinary: Positive for frequency.  Musculoskeletal: Positive for joint pain.  Skin: Negative.   Neurological: Positive for headaches.  Endo/Heme/Allergies: Negative.   Psychiatric/Behavioral: Positive for depression.      Objective:  Physical Exam  Constitutional: She is oriented to person, place, and time. She appears well-developed.  HENT:  Head: Normocephalic.  Eyes: Pupils are equal, round, and reactive to light.  Neck: No JVD present. No tracheal deviation present. No thyromegaly present.  Cardiovascular: Normal rate, regular rhythm and intact distal pulses.  Respiratory: Effort normal and breath sounds normal. No respiratory distress. She has no wheezes.  GI: Soft. There is no abdominal tenderness. There is no guarding.  Musculoskeletal:     Cervical back: Neck supple.     Right knee: Swelling and bony tenderness present. No deformity, erythema, ecchymosis or lacerations (healed previous incision). Decreased range of motion. Tenderness present.  Lymphadenopathy:    She has no cervical adenopathy.  Neurological: She is alert and oriented to person, place, and time.  Skin: Skin is warm and dry.  Psychiatric: She has a normal mood and affect.    Vital signs in last 24 hours: Weight:  [59 kg] 59 kg (06/07 0817)  Labs:   Estimated body mass index is 23.78 kg/m as calculated from the following:   Height as of 01/04/20: 5\' 2"  (1.575 m).   Weight as of 01/04/20: 59 kg.   Imaging Review Plain radiographs demonstrate severe degenerative joint disease of the right knee(s).  The bone quality appears to be good for age and reported activity level.      Assessment/Plan:  End stage arthritis, right knee   The patient history, physical examination, clinical judgment of the  provider and imaging studies are consistent with end stage degenerative joint disease of the right knee(s) and total knee arthroplasty is deemed medically necessary. The treatment options including medical management, injection therapy arthroscopy and arthroplasty were discussed at length. The risks and benefits of total knee arthroplasty were presented and reviewed. The risks due to aseptic loosening, infection, stiffness, patella tracking problems, thromboembolic complications and other imponderables were discussed. The patient acknowledged the explanation, agreed to proceed with the plan and consent was signed. Patient is being admitted for treatment for surgery, pain control, PT, OT,  prophylactic antibiotics, VTE prophylaxis, progressive ambulation and ADL's and discharge planning. The patient is planning to be discharged home.     Patient's anticipated LOS is less than 2 midnights, meeting these requirements: - Lives within 1 hour of care - Has a competent adult at home to recover with post-op recover - NO history of  - Chronic pain requiring opiods  - Diabetes  - Coronary Artery Disease  - Heart failure  - Heart attack  - Stroke  - DVT/VTE  - Cardiac arrhythmia  - Respiratory Failure/COPD  - Anemia  - Advanced Liver disease     West Pugh. Lord Lancour   PA-C  01/04/2020, 11:51 AM

## 2020-01-05 ENCOUNTER — Other Ambulatory Visit: Payer: Self-pay

## 2020-01-05 ENCOUNTER — Encounter (HOSPITAL_COMMUNITY)
Admission: RE | Admit: 2020-01-05 | Discharge: 2020-01-05 | Disposition: A | Discharge status: Home / Self Care | Payer: Medicare HMO | Source: Ambulatory Visit | Attending: Orthopedic Surgery | Admitting: Orthopedic Surgery

## 2020-01-05 DIAGNOSIS — R9431 Abnormal electrocardiogram [ECG] [EKG]: Secondary | ICD-10-CM | POA: Diagnosis not present

## 2020-01-05 DIAGNOSIS — I447 Left bundle-branch block, unspecified: Secondary | ICD-10-CM | POA: Diagnosis not present

## 2020-01-05 DIAGNOSIS — Z01818 Encounter for other preprocedural examination: Secondary | ICD-10-CM | POA: Diagnosis not present

## 2020-01-05 LAB — CBC
HCT: 34.7 % — ABNORMAL LOW (ref 36.0–46.0)
Hemoglobin: 11.3 g/dL — ABNORMAL LOW (ref 12.0–15.0)
MCH: 30.3 pg (ref 26.0–34.0)
MCHC: 32.6 g/dL (ref 30.0–36.0)
MCV: 93 fL (ref 80.0–100.0)
Platelets: 208 10*3/uL (ref 150–400)
RBC: 3.73 MIL/uL — ABNORMAL LOW (ref 3.87–5.11)
RDW: 17 % — ABNORMAL HIGH (ref 11.5–15.5)
WBC: 5.4 10*3/uL (ref 4.0–10.5)
nRBC: 0 % (ref 0.0–0.2)

## 2020-01-05 LAB — BASIC METABOLIC PANEL
Anion gap: 8 (ref 5–15)
BUN: 14 mg/dL (ref 8–23)
CO2: 25 mmol/L (ref 22–32)
Calcium: 9 mg/dL (ref 8.9–10.3)
Chloride: 109 mmol/L (ref 98–111)
Creatinine, Ser: 1.01 mg/dL — ABNORMAL HIGH (ref 0.44–1.00)
GFR calc Af Amer: >60 mL/min (ref >60)
GFR calc non Af Amer: 52 mL/min — ABNORMAL LOW (ref >60)
Glucose, Bld: 85 mg/dL (ref 70–99)
Potassium: 4.1 mmol/L (ref 3.5–5.1)
Sodium: 142 mmol/L (ref 135–145)

## 2020-01-05 LAB — SURGICAL PCR SCREEN
MRSA, PCR: NEGATIVE
Staphylococcus aureus: NEGATIVE

## 2020-01-05 LAB — ABO/RH: ABO/RH(D): O POS

## 2020-01-07 ENCOUNTER — Encounter (HOSPITAL_COMMUNITY): Payer: Self-pay | Admitting: Physician Assistant

## 2020-01-07 NOTE — Progress Notes (Signed)
Anesthesia Chart Review   Case: [id number] Date/Time: 01/12/20 0950   Procedure: TOTAL KNEE ARTHROPLASTY (Right Knee) - 70 mins   Anesthesia type: Spinal   Pre-op diagnosis: Right knee osteoarthritis   Location: [facility] ROOM 09 / [facility]   Surgeons: Paralee Cancel, MD      DISCUSSION:79 y.o. former smoker with h/o seizures, HTN, HLD, CHF, CAD, LBBB, right knee OA scheduled for above procedure 01/12/2020 with Dr. Paralee Cancel.    Clearance received from PCP, Dr. Mellody Drown, 5/17/20201 (on chart) which states pt is low risk for surgical procedure.  Advised to stop Plavix 7 days prior to surgery.    Cardiac clearance pending.    Addendum 01/08/2020:  Per cardiology pt needs an appointment to assess cardiovascular risk.  Per Judeen Hammans with Dr. Aurea Graff office surgery will be rescheduled.   VS: BP (!) 146/72    Pulse 68    Temp 36.9 C (Oral)    Resp 18    Ht 5\' 2"  (1.575 m)    Wt 60.9 kg    SpO2 98%    BMI 24.55 kg/m   PROVIDERS: Jilda Panda, MD is PCP   Adrian Prows, MD is Cardiologist  LABS: Labs reviewed: Acceptable for surgery. (all labs ordered are listed, but only abnormal results are displayed)  Labs Reviewed  BASIC METABOLIC PANEL - Abnormal; Notable for the following components:      Result Value   Creatinine, Ser 1.01 (*)    GFR calc non Af Amer 52 (*)    All other components within normal limits  CBC - Abnormal; Notable for the following components:   RBC 3.73 (*)    Hemoglobin 11.3 (*)    HCT 34.7 (*)    RDW 17.0 (*)    All other components within normal limits  SURGICAL PCR SCREEN  TYPE AND SCREEN  ABO/RH     IMAGES:   EKG: 01/05/2020 Rate 67 bpm Normal sinus rhythm Left axis deviation Left bundle branch block Abnormal ECG No significant change since last tracing  CV: Echo 04/16/2002 SUMMARY  - The left ventricle was mildly dilated. Overall left ventricular     systolic function was mildly decreased. Left ventricular     ejection fraction was estimated ,  range being 40 % to 50 %.  - There was mild to moderate mitral valvular regurgitation.   IMPRESSIONS  - There was no echocardiographic evidence for a cardiac source of     embolism.    Past Medical History:  Diagnosis Date   Arthritis    Brittle bones    Cardiomyopathy ([place])    CHF (congestive heart failure) (HCC)    Coronary artery disease    Depression    Headache    Hyperlipidemia    Hypertension    Seizures (HCC)    TIA (transient ischemic attack)     Past Surgical History:  Procedure Laterality Date   ABDOMINAL HYSTERECTOMY     APPENDECTOMY     CARDIAC CATHETERIZATION     CHOLECYSTECTOMY     FRACTURE SURGERY     ankle right   PATELLA FRACTURE SURGERY     1964    MEDICATIONS:  acetaminophen (TYLENOL) 325 MG tablet   aspirin EC 81 MG tablet   atorvastatin (LIPITOR) 40 MG tablet   BYSTOLIC 5 MG tablet   carvedilol (COREG) 6.25 MG tablet   citalopram (CELEXA) 40 MG tablet   clonazePAM (KLONOPIN) 0.5 MG tablet   clopidogrel (PLAVIX) 75 MG tablet  fenofibrate 160 MG tablet   furosemide (LASIX) 40 MG tablet   KLOR-CON M20 20 MEQ tablet   Vitamin D3 (VITAMIN D) 25 MCG tablet   No current facility-administered medications for this encounter.     Janey Genta [facility] Pre-Surgical Testing [phone] 01/08/20  4:24 PM

## 2020-01-08 ENCOUNTER — Telehealth: Payer: Self-pay | Admitting: Cardiology

## 2020-01-08 ENCOUNTER — Other Ambulatory Visit (HOSPITAL_COMMUNITY)
Admission: RE | Admit: 2020-01-08 | Discharge: 2020-01-08 | Disposition: A | Discharge status: Home / Self Care | Payer: Medicare HMO | Source: Ambulatory Visit | Attending: Orthopedic Surgery | Admitting: Orthopedic Surgery

## 2020-01-08 DIAGNOSIS — Z01812 Encounter for preprocedural laboratory examination: Secondary | ICD-10-CM | POA: Diagnosis present

## 2020-01-08 DIAGNOSIS — Z20822 Contact with and (suspected) exposure to covid-19: Secondary | ICD-10-CM | POA: Diagnosis not present

## 2020-01-08 LAB — SARS CORONAVIRUS 2 (TAT 6-24 HRS): SARS Coronavirus 2: NEGATIVE

## 2020-01-10 NOTE — Telephone Encounter (Signed)
I am pre-charting from [employer].  If she decides to come for evaluation. Who ever sees her, willl be able to view this.  Caucasian female with past medical history of hypertension, hyperlipidemia, previous history of TIA, chronic LBBBB, seizure disorder, very mild CAD by coronary angiogram in August 2011 that revealed mild multivessel CAD referred to [place] for evaluation of chest pain and near syncope. Was previously on Coumadin for TIA's that was stopped in 2013.  She has had a low risk nuclear stress test in 2019 (reduced EF probably due to LBBB) but normal LVEF by echocardiogram. She has had no recurrence of syncope since May 2019, remains essentially asymptomatic. She was found to have severe hypoxemia by nocturnal oximetry and was on nocturnal oxygen therapy however she has discontinued this due to cost. Fortunately she still has remained asymptomatic with regard to chest pain, fatigue or dizziness. Has mild chronic dyspnea. She was made PRN visit on 05/15/18.  Lexiscan myoview stress test 12/16/2017: 1. Lexiscan stress test was performed. Exercise capacity was not assessed. Stress symptoms included dizziness. Blood pressure was 96/58 mmHg. Stress EKG is non diagnostic for ischemia as it is a pharmacologic stress. Additionally, it showed normal sinus rhythm and LBBB. 2. The overall quality of the study is fair. Review of the raw data in a rotational cine format reveals breast attenuation, with imaging performed in sitting position. Gated SPECT images reveal normal myocardial thickening and wall motion. The left ventricular ejection fraction was calculated to be 34%, although appears normal by visual estimation. Stress and rest SPECT images demonstrate medium sized area of mild intensity perfusion defect without reversibility. This likely represents breast attenuation artifact, although ischemia in this region cannot be excluded. 3. Low to intermediate risk study.  Event Monitor  01/03/2018-02/01/2018:   Baseline heart rate normal sinus rhythm. 6 patient triggered events occurred without any reported symptoms correlating with normal sinus rhythm. When autodetect at event occurred correlating with normal sinus rhythm with artifact. Lowest bradycardic heart rate was 49 bpm on 6/12 at 2:44 PM. Fastest tachycardia event was 107 bpm on 6/95 10:39 AM. No A. fib or SVT was noted  Echocardiogram  12/27/2017 1. Left ventricle cavity is normal in size. Borderline normal LV syst. function, Visual EF is approx. 50%. Abnormal septal wall motion due to left bundle branch block. Doppler evidence of grade I (impaired) diastolic dysfunction. Calculated EF 53%. 2. Mild (Grade I) mitral regurgitation. 3. Mild tricuspid regurgitation. No evidence of pulmonary hypertension.  Nocturnal oximetry  [12/11/2017]:  Nocturnal oximetry 12/11/2017:48.6 minutes less than or equal to 88%. 88.5 minutes of less than or equal to 89%. 190 oxygen desaturation events occured. It appears this patient qualifies for nocturnal oxygen per Medicare guidelines by group 1.

## 2020-01-11 ENCOUNTER — Other Ambulatory Visit: Payer: Self-pay

## 2020-01-11 ENCOUNTER — Ambulatory Visit: Payer: Medicare HMO | Admitting: Cardiology

## 2020-01-11 ENCOUNTER — Encounter: Payer: Self-pay | Admitting: Cardiology

## 2020-01-11 VITALS — BP 158/73 | HR 75 | Ht 62.0 in | Wt 130.0 lb

## 2020-01-11 DIAGNOSIS — Z01810 Encounter for preprocedural cardiovascular examination: Secondary | ICD-10-CM

## 2020-01-11 DIAGNOSIS — I447 Left bundle-branch block, unspecified: Secondary | ICD-10-CM | POA: Insufficient documentation

## 2020-01-11 DIAGNOSIS — I10 Essential (primary) hypertension: Secondary | ICD-10-CM

## 2020-01-11 DIAGNOSIS — I2510 Atherosclerotic heart disease of native coronary artery without angina pectoris: Secondary | ICD-10-CM

## 2020-01-11 DIAGNOSIS — Z87891 Personal history of nicotine dependence: Secondary | ICD-10-CM

## 2020-01-11 DIAGNOSIS — E782 Mixed hyperlipidemia: Secondary | ICD-10-CM

## 2020-01-11 DIAGNOSIS — Z8673 Personal history of transient ischemic attack (TIA), and cerebral infarction without residual deficits: Secondary | ICD-10-CM

## 2020-01-11 NOTE — Progress Notes (Signed)
Anna Salazar Date of Birth: 10-28-1940 MRN: [id number] Primary Care Provider:Moreira, Channing Mutters, MD Former Cardiology Providers: Dr. Yates Decamp Primary Cardiologist: Tessa Lerner, DO, [facility] (established care 01/11/2020)  Date: 01/11/20  Chief Complaint  Patient presents with  . Pre-op Exam    pt having knee surgery  . Follow-up    HPI  Anna Salazar is a 79 y.o.  female who presents to the office with a chief complaint of " preop clearance." Patient's past medical history and cardiovascular risk factors include: hypertension, hyperlipidemia, previous history of TIA, chronic LBBBB, seizure disorder, very mild CAD by coronary angiogram in August 2011 that revealed mild multivessel CAD, former smoker, postmenopausal female, advanced age.  Patient is accompanied by her husband at today's office visit.  She has had a low risk nuclear stress test in 2019 (reduced EF probably due to LBBB) but normal LVEF by echocardiogram. She has had no recurrence of syncope since May 2019, remains essentially asymptomatic. She was found to have severe hypoxemia by nocturnal oximetry and was on nocturnal oxygen therapy however she has discontinued this due to cost.   Now presents to the office for preoperative risk stratification. She is scheduled for right-sided total knee arthroplasty on 01/12/2020 by Dr Durene Romans. Patient denies any active chest pain or anginal equivalent. Patient denies history of congestive heart failure Prior history of TIA Patient is nondiabetic. Most recent serum creatinine level less than 2 mg/dL  In regards to functional capacity patient states that she is less active than she was back in 2019.  In fact, she gets more short of breath with effort related activities, as well fatigue, feels more tired, and takes frequent breaks to do the same activities that she was doing back in 2019.  She has been attributing this to aging and therefore has not seek medical  attention.  Patient's blood is elevated today's office visit because she has stopped taking all medications prior to her upcoming surgery.  I educated patient that it is probably miscommunication and they probably wanted her to hold Plavix  prior to the surgery and not hold all of her medications.  I have asked her to reconfirm this with the physician's office who recommended this for clarification purposes.  In addition, patient does not know exactly what medication she takes and therefore she is asked to call the office back to provide an accurate medication reconciliation.  ALLERGIES: Allergies  Allergen Reactions  . Tegretol [Carbamazepine] Hives   MEDICATION LIST PRIOR TO VISIT: Current Outpatient Medications on File Prior to Visit  Medication Sig Dispense Refill  . acetaminophen (TYLENOL) 325 MG tablet Take 650 mg by mouth daily as needed for mild pain (headaches.).     Marland Kitchen aspirin EC 81 MG tablet Take 81 mg by mouth daily.    . carvedilol (COREG) 6.25 MG tablet Take 6.25 mg by mouth 2 (two) times daily with a meal.     . citalopram (CELEXA) 40 MG tablet Take 40 mg by mouth every evening.     . clonazePAM (KLONOPIN) 0.5 MG tablet Take 0.5 mg by mouth 2 (two) times daily.     . clopidogrel (PLAVIX) 75 MG tablet Take 75 mg by mouth daily.    . fenofibrate 160 MG tablet Take 160 mg by mouth daily.    . furosemide (LASIX) 40 MG tablet Take 40 mg by mouth daily.    . Vitamin D3 (VITAMIN D) 25 MCG tablet Take 1,000 Units by mouth daily.  No current facility-administered medications on file prior to visit.    PAST MEDICAL HISTORY: Past Medical History:  Diagnosis Date  . Arthritis   . Brittle bones   . Coronary artery disease   . Depression   . Headache   . Hyperlipidemia   . Hypertension   . Seizures (HCC)   . TIA (transient ischemic attack)     PAST SURGICAL HISTORY: Past Surgical History:  Procedure Laterality Date  . ABDOMINAL HYSTERECTOMY    . APPENDECTOMY    .  CARDIAC CATHETERIZATION    . CHOLECYSTECTOMY    . FRACTURE SURGERY     ankle right  . PATELLA FRACTURE SURGERY     1964    FAMILY HISTORY: The patient's family history includes Cancer in her sister.   SOCIAL HISTORY:  The patient  reports that she has quit smoking. She has never used smokeless tobacco. She reports that she does not drink alcohol and does not use drugs.  Review of Systems  Constitutional: Positive for malaise/fatigue. Negative for chills and fever.  HENT: Negative for hoarse voice and nosebleeds.   Eyes: Negative for discharge, double vision and pain.  Cardiovascular: Negative for chest pain, claudication, dyspnea on exertion, leg swelling, near-syncope, orthopnea, palpitations, paroxysmal nocturnal dyspnea and syncope.  Respiratory: Positive for shortness of breath. Negative for hemoptysis.   Musculoskeletal: Positive for joint pain. Negative for muscle cramps and myalgias.  Gastrointestinal: Negative for abdominal pain, constipation, diarrhea, hematemesis, hematochezia, melena, nausea and vomiting.  Neurological: Negative for dizziness and light-headedness.    PHYSICAL EXAM: Vitals with BMI 01/11/2020 01/11/2020 01/05/2020  Height - 5\' 2"  5\' 2"   Weight - 130 lbs 134 lbs 4 oz  BMI - 23.77 24.55  Systolic 158 166  Diastolic 73 72 72  Pulse 75 76 68    CONSTITUTIONAL: Elderly appearing female, ambulating with a cane, hemodynamically stable, no acute distress.   SKIN: Skin is warm and dry. No rash noted. No cyanosis. No pallor. No jaundice HEAD: Normocephalic and atraumatic.  EYES: No scleral icterus MOUTH/THROAT: Moist oral membranes.  NECK: No JVD present. No thyromegaly noted. No carotid bruits  LYMPHATIC: No visible cervical adenopathy.  CHEST Normal respiratory effort. No intercostal retractions  LUNGS: Clear to auscultation bilaterally.  No stridor. No wheezes. No rales.  CARDIOVASCULAR: Regular rate rhythm, positive S1-S2, no murmurs rubs or gallops  appreciated ABDOMINAL: No apparent ascites.  EXTREMITIES: Trace bilateral pitting edema.  Warm to touch. HEMATOLOGIC: No significant bruising NEUROLOGIC: Oriented to person, place, and time. Nonfocal. Normal muscle tone.  PSYCHIATRIC: Normal mood and affect. Normal behavior. Cooperative  CARDIAC DATABASE: EKG: 01/11/2020: Normal sinus rhythm, 85 bpm, left bundle branch block, poor R wave progression, without underlying injury pattern.   Echocardiogram: 12/27/2017: LVEF 50%, LV cavity size normal, abnormal septal motion due to LBBB, grade 1 diastolic impairment, mild MR, mild TR, no pulmonary hypertension.  Stress Testing:  Lexiscan myoview stress test 12/16/2017: Review of the raw data in a rotational cine format reveals breast attenuation, with imaging performed in sitting position. Gated SPECT images reveal normal myocardial thickening and wall motion. The left ventricular ejection fraction was calculated to be 34%, although appears normal by visual estimation. Stress and rest SPECT images demonstrate medium sized area of mild intensity perfusion defect without reversibility. This likely represents breast attenuation artifact, although ischemia in this region cannot be excluded. Low to intermediate risk study.  Event Monitor  01/03/2018-02/01/2018:  Baseline heart rate normal sinus rhythm. 6 patient triggered events  occurred without any reported symptoms correlating with normal sinus rhythm. When autodetect at event occurred correlating with normal sinus rhythm with artifact. Lowest bradycardic heart rate was 49 bpm on 6/12 at 2:44 PM. Fastest tachycardia event was 107 bpm on 6/95 10:39 AM. No A. fib or SVT was noted  LABORATORY DATA: CBC Latest Ref Rng & Units 01/05/2020 05/21/2012 05/20/2012  WBC 4.0 - 10.5 K/uL 5.4 7.4 7.6  Hemoglobin 12.0 - 15.0 g/dL 11.3(L) 11.4(L) 11.2(L)  Hematocrit 36 - 46 % 34.7(L) 34.2(L) 34.1(L)  Platelets 150 - 400 K/uL 208 221 224    CMP Latest Ref Rng & Units  01/05/2020 05/21/2012 05/20/2012  Glucose 70 - 99 mg/dL 85 83 88  BUN 8 - 23 mg/dL 14 17 20   Creatinine 0.44 - 1.00 mg/dL ) 1.01(B) 5.10(C)  Sodium 135 - 145 mmol/L 142 147(H) 142  Potassium 3.5 - 5.1 mmol/L 4.1 4.3 3.9  Chloride 98 - 111 mmol/L 109 114(H) 109  CO2 22 - 32 mmol/L 25 26 28   Calcium 8.9 - 10.3 mg/dL 9.0 8.8 8.7  Total Protein 6.0 - 8.3 g/dL - - 5.3(L)  Total Bilirubin 0.3 - 1.2 mg/dL - - 0.4  Alkaline Phos 39 - 117 U/L - - 30(L)  AST 0 - 37 U/L - - 16  ALT 0 - 35 U/L - - 7    Lipid Panel     Component Value Date/Time   CHOL 105 05/20/2012 0630   TRIG 73 05/20/2012 0630   HDL 50 05/20/2012 0630   CHOLHDL 2.1 05/20/2012 0630   VLDL 15 05/20/2012 0630   LDLCALC 40 05/20/2012 0630    Lab Results  Component Value Date   HGBA1C 5.9 (H) 05/20/2012   No components found for: NTPROBNP Lab Results  Component Value Date   TSH 1.648 05/20/2012   TSH 0.698 03/07/2010    Cardiac Panel (last 3 results) No results for input(s): CKTOTAL, CKMB, TROPONINIHS, RELINDX in the last 72 hours.  IMPRESSION:    ICD-10-CM   1. Preoperative cardiovascular examination  Z01.810 EKG 12-Lead    PCV ECHOCARDIOGRAM COMPLETE W BUBBLE    PCV MYOCARDIAL PERFUSION WITH LEXISCAN  2. Benign hypertension  I10   3. Mixed hyperlipidemia  E78.2   4. Nonobstructive atherosclerosis of coronary artery  I25.10 PCV ECHOCARDIOGRAM COMPLETE W BUBBLE  5. LBBB (left bundle branch block)  I44.7 PCV MYOCARDIAL PERFUSION WITH LEXISCAN  6. Former smoker  Z87.891   7. History of TIA (transient ischemic attack)  Z86.73      RECOMMENDATIONS: Anna Salazar is a 78 y.o. female whose past medical history and cardiovascular risk factors include: hypertension, hyperlipidemia, previous history of TIA, chronic LBBBB, seizure disorder, very mild CAD by coronary angiogram in August 2011 that revealed mild multivessel CAD, former smoker, postmenopausal female, advanced age.  Preoperative cardiovascular  examination:  Patient was scheduled for a knee replacement today but it was canceled due to her requiring cardiac risk stratification.   Patient is scheduled for a right knee replacement be performed at [facility], date to be determined, and type of anesthesia spinal.   Since her last cardiac evaluation in 2019 her overall functional status has decreased as discussed above.    No active symptoms.  Echocardiogram will be ordered to evaluate for structural heart disease and left ventricular systolic function.  Nuclear stress test recommended to evaluate for reversible ischemia.  Patient is not a candidate for exercise stress test as she cannot walk again and has a  underlying left bundle branch block.  Preoperative risk stratification forthcoming.  Benign essential hypertension:  Office blood pressure currently not at goal.  Patient states that she stopped taking all her medications as she was scheduled for surgery.  She is asked to call the office back to reconcile her medications and restart antihypertensive medications.  Left bundle branch block: Chronic and stable.  Nonobstructive coronary artery disease: Continue aspirin and statin therapy.    FINAL MEDICATION LIST END OF ENCOUNTER: No orders of the defined types were placed in this encounter.    Current Outpatient Medications:  .  acetaminophen (TYLENOL) 325 MG tablet, Take 650 mg by mouth daily as needed for mild pain (headaches.). , Disp: , Rfl:  .  aspirin EC 81 MG tablet, Take 81 mg by mouth daily., Disp: , Rfl:  .  carvedilol (COREG) 6.25 MG tablet, Take 6.25 mg by mouth 2 (two) times daily with a meal. , Disp: , Rfl:  .  citalopram (CELEXA) 40 MG tablet, Take 40 mg by mouth every evening. , Disp: , Rfl:  .  clonazePAM (KLONOPIN) 0.5 MG tablet, Take 0.5 mg by mouth 2 (two) times daily. , Disp: , Rfl:  .  clopidogrel (PLAVIX) 75 MG tablet, Take 75 mg by mouth daily., Disp: , Rfl:  .  fenofibrate 160 MG tablet, Take 160  mg by mouth daily., Disp: , Rfl:  .  furosemide (LASIX) 40 MG tablet, Take 40 mg by mouth daily., Disp: , Rfl:  .  Vitamin D3 (VITAMIN D) 25 MCG tablet, Take 1,000 Units by mouth daily., Disp: , Rfl:   Orders Placed This Encounter  Procedures  . PCV MYOCARDIAL PERFUSION WITH LEXISCAN  . EKG 12-Lead  . PCV ECHOCARDIOGRAM COMPLETE W BUBBLE   --Continue cardiac medications as reconciled in final medication list. --Return in about 6 weeks (around 02/22/2020) for re-evaluation of symptoms.. Or sooner if needed. --Continue follow-up with your primary care physician regarding the management of your other chronic comorbid conditions.  Patient's questions and concerns were addressed to her satisfaction. She voices understanding of the instructions provided during this encounter.   This note was created using a voice recognition software as a result there may be grammatical errors inadvertently enclosed that do not reflect the nature of this encounter. Every attempt is made to correct such errors.  Rex Kras, [place], [facility]  Pager: [phone] Office: [phone]

## 2020-01-12 ENCOUNTER — Ambulatory Visit (HOSPITAL_COMMUNITY): Admission: RE | Admit: 2020-01-12 | Payer: Medicare HMO | Source: Home / Self Care | Admitting: Orthopedic Surgery

## 2020-01-12 ENCOUNTER — Encounter (HOSPITAL_COMMUNITY): Admission: RE | Payer: Self-pay | Source: Home / Self Care

## 2020-01-12 LAB — TYPE AND SCREEN
ABO/RH(D): O POS
Antibody Screen: NEGATIVE

## 2020-01-12 SURGERY — ARTHROPLASTY, KNEE, TOTAL
Anesthesia: Spinal | Site: Knee | Laterality: Right

## 2020-01-14 ENCOUNTER — Other Ambulatory Visit: Payer: Self-pay

## 2020-01-14 ENCOUNTER — Ambulatory Visit: Payer: Medicare HMO

## 2020-01-14 DIAGNOSIS — Z01810 Encounter for preprocedural cardiovascular examination: Secondary | ICD-10-CM

## 2020-01-14 DIAGNOSIS — I2510 Atherosclerotic heart disease of native coronary artery without angina pectoris: Secondary | ICD-10-CM

## 2020-01-18 ENCOUNTER — Ambulatory Visit: Payer: Medicare HMO

## 2020-01-18 ENCOUNTER — Other Ambulatory Visit: Payer: Self-pay

## 2020-01-18 DIAGNOSIS — Z01810 Encounter for preprocedural cardiovascular examination: Secondary | ICD-10-CM

## 2020-01-18 DIAGNOSIS — I447 Left bundle-branch block, unspecified: Secondary | ICD-10-CM

## 2020-01-19 NOTE — Progress Notes (Signed)
Patient will be in this Thursday to see you.

## 2020-01-21 ENCOUNTER — Ambulatory Visit: Payer: Medicare HMO | Admitting: Cardiology

## 2020-01-21 ENCOUNTER — Other Ambulatory Visit: Payer: Self-pay

## 2020-01-21 ENCOUNTER — Encounter: Payer: Self-pay | Admitting: Cardiology

## 2020-01-21 VITALS — BP 154/64 | HR 69 | Ht 62.0 in | Wt 130.2 lb

## 2020-01-21 DIAGNOSIS — Z87891 Personal history of nicotine dependence: Secondary | ICD-10-CM

## 2020-01-21 DIAGNOSIS — I429 Cardiomyopathy, unspecified: Secondary | ICD-10-CM

## 2020-01-21 DIAGNOSIS — Z8673 Personal history of transient ischemic attack (TIA), and cerebral infarction without residual deficits: Secondary | ICD-10-CM

## 2020-01-21 DIAGNOSIS — I447 Left bundle-branch block, unspecified: Secondary | ICD-10-CM

## 2020-01-21 DIAGNOSIS — E782 Mixed hyperlipidemia: Secondary | ICD-10-CM

## 2020-01-21 DIAGNOSIS — I10 Essential (primary) hypertension: Secondary | ICD-10-CM

## 2020-01-21 MED ORDER — LOSARTAN POTASSIUM 25 MG PO TABS: 25.0000 mg | ORAL_TABLET | Freq: Every day | ORAL | 0 refills | Status: DC

## 2020-01-21 NOTE — Progress Notes (Signed)
Anna Salazar Date of Birth: 1941/05/14 MRN: [id number] Primary Care Provider:Moreira, Carloyn Manner, MD Former Cardiology Providers: Dr. Adrian Prows Primary Cardiologist: Rex Kras, DO, [facility] (established care 01/11/2020)  Date: 01/21/20  Chief Complaint  Patient presents with  . Follow-up    stress test results    HPI  Anna Salazar is a 79 y.o.  female who presents to the office with a chief complaint of " review test results." Patient's past medical history and cardiovascular risk factors include: hypertension, hyperlipidemia, previous history of TIA, chronic LBBBB, seizure disorder, very mild CAD by coronary angiogram in August 2011 that revealed mild multivessel CAD, former smoker, postmenopausal female, advanced age.  Patient is accompanied by her son (Ed) at today's office visit.  At the last office visit patient presented to the office for preoperative or stratification for upcoming right-sided total knee arthroplasty.  Patient was originally scheduled for surgery on January 12, 2020 but it was canceled as anesthesiology wanted cardiology restratification.  At the last office visit on January 11, 2020 patient noted that she was having shortness of breath with effort related activities, feeling fatigue, taking frequent breaks for the same activities as she was doing back in 2019.  As result she was recommended to undergo an echocardiogram and nuclear stress test.  Since last office visit patient had an echocardiogram which noted LVEF of 35% compared to 50% in 2019.  She also has moderate global and severe anterior hypokinesis on echocardiogram.  Her nuclear stress test also notes reversible ischemia in the inferior, septal and apical regions.  ALLERGIES: Allergies  Allergen Reactions  . Morphine And Related Other (See Comments)    AMS  . Sulfa Antibiotics Hives  . Tegretol [Carbamazepine] Hives   MEDICATION LIST PRIOR TO VISIT: Current Outpatient Medications on File Prior to Visit   Medication Sig Dispense Refill  . acetaminophen (TYLENOL) 325 MG tablet Take 650 mg by mouth daily as needed for mild pain (headaches.).     Marland Kitchen aspirin EC 81 MG tablet Take 81 mg by mouth daily.    Marland Kitchen atorvastatin (LIPITOR) 40 MG tablet Take 40 mg by mouth daily.    . carvedilol (COREG) 6.25 MG tablet Take 6.25 mg by mouth 2 (two) times daily with a meal.     . citalopram (CELEXA) 40 MG tablet Take 40 mg by mouth every evening.     . clonazePAM (KLONOPIN) 0.5 MG tablet Take 0.5 mg by mouth 2 (two) times daily.     . clopidogrel (PLAVIX) 75 MG tablet Take 75 mg by mouth daily.    . fenofibrate 160 MG tablet Take 160 mg by mouth daily.    . nebivolol (BYSTOLIC) 10 MG tablet Take 10 mg by mouth daily.    . nitroGLYCERIN (NITROSTAT) 0.4 MG SL tablet Place 0.4 mg under the tongue every 5 (five) minutes as needed for chest pain.    . Vitamin D3 (VITAMIN D) 25 MCG tablet Take 1,000 Units by mouth daily.     No current facility-administered medications on file prior to visit.    PAST MEDICAL HISTORY: Past Medical History:  Diagnosis Date  . Arthritis   . Brittle bones   . Coronary artery disease   . Depression   . Headache   . Hyperlipidemia   . Hypertension   . Seizures ([place])   . TIA (transient ischemic attack)     PAST SURGICAL HISTORY: Past Surgical History:  Procedure Laterality Date  . ABDOMINAL HYSTERECTOMY    .  APPENDECTOMY    . CARDIAC CATHETERIZATION    . CHOLECYSTECTOMY    . FRACTURE SURGERY     ankle right  . PATELLA FRACTURE SURGERY     1964    FAMILY HISTORY: The patient's family history includes Cancer in her sister.   SOCIAL HISTORY:  The patient  reports that she has quit smoking. She has never used smokeless tobacco. She reports that she does not drink alcohol and does not use drugs.  Review of Systems  Constitutional: Positive for malaise/fatigue. Negative for chills and fever.  HENT: Negative for hoarse voice and nosebleeds.   Eyes: Negative for  discharge, double vision and pain.  Cardiovascular: Negative for chest pain, claudication, dyspnea on exertion, leg swelling, near-syncope, orthopnea, palpitations, paroxysmal nocturnal dyspnea and syncope.  Respiratory: Positive for shortness of breath. Negative for hemoptysis.   Musculoskeletal: Positive for joint pain. Negative for muscle cramps and myalgias.  Gastrointestinal: Negative for abdominal pain, constipation, diarrhea, hematemesis, hematochezia, melena, nausea and vomiting.  Neurological: Negative for dizziness and light-headedness.    PHYSICAL EXAM: Vitals with BMI 01/21/2020 01/11/2020 01/11/2020  Height 5\' 2"  - 5\' 2"   Weight 130 lbs 3 oz - 130 lbs  BMI 23.81 - 23.77  Systolic 154 158  Diastolic 64 73 72  Pulse 69 75 76    CONSTITUTIONAL: Elderly appearing female, ambulating with a cane, hemodynamically stable, no acute distress.   SKIN: Skin is warm and dry. No rash noted. No cyanosis. No pallor. No jaundice HEAD: Normocephalic and atraumatic.  EYES: No scleral icterus MOUTH/THROAT: Moist oral membranes.  NECK: No JVD present. No thyromegaly noted. No carotid bruits  LYMPHATIC: No visible cervical adenopathy.  CHEST Normal respiratory effort. No intercostal retractions  LUNGS: Clear to auscultation bilaterally.  No stridor. No wheezes. No rales.  CARDIOVASCULAR: Regular rate rhythm, positive S1-S2, no murmurs rubs or gallops appreciated ABDOMINAL: No apparent ascites.  EXTREMITIES: Trace bilateral pitting edema.  Warm to touch. HEMATOLOGIC: No significant bruising NEUROLOGIC: Oriented to person, place, and time. Nonfocal. Normal muscle tone.  PSYCHIATRIC: Normal mood and affect. Normal behavior. Cooperative  CARDIAC DATABASE: EKG: 01/11/2020: Normal sinus rhythm, 85 bpm, left bundle branch block, poor R wave progression, without underlying injury pattern.   Echocardiogram: 12/27/2017: LVEF 50%, LV cavity size normal, abnormal septal motion due to LBBB, grade 1  diastolic impairment, mild MR, mild TR, no pulmonary hypertension.  01/14/2020: LVEF 35%, moderate global and severe anterior hypokinesis, mild LVH, grade 1 diastolic impairment, normal LAP, mild left atrial dilatation, mild to moderate AR, moderate MR, moderate TR, moderate pulmonary hypertension, RVSP 50 mmHg.  Stress Testing:  Lexiscan Tetrofosmin Stress Test  01/18/2020: Non-diagnostic ECG stress. Myocardial perfusion is abnormal. There is a moderate degree large extent defect suggestive of scar with superimposed reversible defect in the inferior, septal and apical regions.  Gated SPECT imaging of the left ventricle was abnormal,  demonstrating global hypokinesis. Severely enlarged left ventricle. Overall LV systolic function is abnormal without regional wall motion abnormalities. Global hypokinesis of the left ventricle. Stress LV EF: 23%.  High risk study.  Compared to 12/16/2017 report, LVEF 34%, and no definite ischemia noted. Clinical correlation recommended.   Event Monitor  01/03/2018-02/01/2018:  Baseline heart rate normal sinus rhythm. 6 patient triggered events occurred without any reported symptoms correlating with normal sinus rhythm. When autodetect at event occurred correlating with normal sinus rhythm with artifact. Lowest bradycardic heart rate was 49 bpm on 6/12 at 2:44 PM. Fastest tachycardia event was 107 bpm  on 6/95 10:39 AM. No A. fib or SVT was noted  LABORATORY DATA: CBC Latest Ref Rng & Units 01/05/2020 05/21/2012 05/20/2012  WBC 4.0 - 10.5 K/uL 5.4 7.4 7.6  Hemoglobin 12.0 - 15.0 g/dL 11.3(L) 11.4(L) 11.2(L)  Hematocrit 36 - 46 % 34.7(L) 34.2(L) 34.1(L)  Platelets 150 - 400 K/uL 208 221 224    CMP Latest Ref Rng & Units 01/05/2020 05/21/2012 05/20/2012  Glucose 70 - 99 mg/dL 85 83 88  BUN 8 - 23 mg/dL 14 17 20   Creatinine 0.44 - 1.00 mg/dL ) 2.26(J) 3.35(K)  Sodium 135 - 145 mmol/L 142 147(H) 142  Potassium 3.5 - 5.1 mmol/L 4.1 4.3 3.9  Chloride 98 - 111  mmol/L 109 114(H) 109  CO2 22 - 32 mmol/L 25 26 28   Calcium 8.9 - 10.3 mg/dL 9.0 8.8 8.7  Total Protein 6.0 - 8.3 g/dL - - 5.3(L)  Total Bilirubin 0.3 - 1.2 mg/dL - - 0.4  Alkaline Phos 39 - 117 U/L - - 30(L)  AST 0 - 37 U/L - - 16  ALT 0 - 35 U/L - - 7    Lipid Panel     Component Value Date/Time   CHOL 105 05/20/2012 0630   TRIG 73 05/20/2012 0630   HDL 50 05/20/2012 0630   CHOLHDL 2.1 05/20/2012 0630   VLDL 15 05/20/2012 0630   LDLCALC 40 05/20/2012 0630    Lab Results  Component Value Date   HGBA1C 5.9 (H) 05/20/2012   No components found for: NTPROBNP Lab Results  Component Value Date   TSH 1.648 05/20/2012   TSH 0.698 03/07/2010    Cardiac Panel (last 3 results) No results for input(s): CKTOTAL, CKMB, TROPONINIHS, RELINDX in the last 72 hours.  IMPRESSION:    ICD-10-CM   1. Cardiomyopathy, unspecified type (HCC)  I42.9 Basic metabolic panel    Magnesium    CBC    CBC    Magnesium    Basic metabolic panel  2. Benign hypertension  I10   3. Mixed hyperlipidemia  E78.2   4. LBBB (left bundle branch block)  I44.7   5. Former smoker  Z87.891   6. History of TIA (transient ischemic attack)  Z86.73      RECOMMENDATIONS: ERIENNE SPELMAN is a 79 y.o. female whose past medical history and cardiovascular risk factors include: hypertension, hyperlipidemia, previous history of TIA, chronic LBBBB, seizure disorder, very mild CAD by coronary angiogram in August 2011 that revealed mild multivessel CAD, former smoker, postmenopausal female, advanced age.  Abnormal nuclear stress test:  Given the change in clinical status and upcoming noncardiac surgery patient was recommended to undergo an ischemic evaluation.  Echocardiogram notes moderately reduced left ventricular systolic function with global and regional wall motion abnormalities.  Nuclear stress test results were independently reviewed with the patient and her son which notes a reversible perfusion defect  concerning for ischemia in the RCA/LCx distribution.  Continue dual antiplatelet therapy.  Continue carvedilol.  Continue statin therapy.  Continue fenofibrate.  We will add losartan 25 mg p.o. daily  Repeat blood work in 1 week to evaluate kidney function and electrolytes.  We will transition to [facility] once left heart catheterization is complete.  Patient has sublingual nitroglycerin tablets to use for as needed basis if needed.  The left heart catheterization procedure was explained to the patient in detail. The indication, alternatives, risks and benefits were reviewed. Complications including but not limited to bleeding, infection, acute kidney injury, blood transfusion, heart rhythm  disturbances, contrast (dye) reaction, damage to the arteries or nerves in the legs or hands, cerebrovascular accident, myocardial infarction, need for emergent bypass surgery, blood clots in the legs, possible need for emergent blood transfusion, and rarely death were reviewed and discussed with the patient. The patient voices understanding and wishes to proceed.   In the interim, if she has worsening shortness of breath with effort related activities she is recommended to go to the closest ER via EMS for further evaluation.  Plan of care discussed with both the patient and her son at today's office visit.  Cardiomyopathy, etiology unknown:  Continue current guideline directed medical therapy.  She will need to have her medications uptitrated to guideline directed directed medical therapy  Benign essential hypertension:  Start losartan 25 mg p.o. daily.  Continue her home medications.  Currently managed by per primary team.  Left bundle branch block: Chronic and stable.  Total encounter time 40 minutes.  FINAL MEDICATION LIST END OF ENCOUNTER: Meds ordered this encounter  Medications  . losartan (COZAAR) 25 MG tablet    Sig: Take 1 tablet (25 mg total) by mouth daily.    Dispense:  90  tablet    Refill:  0     Current Outpatient Medications:  .  acetaminophen (TYLENOL) 325 MG tablet, Take 650 mg by mouth daily as needed for mild pain (headaches.). , Disp: , Rfl:  .  aspirin EC 81 MG tablet, Take 81 mg by mouth daily., Disp: , Rfl:  .  atorvastatin (LIPITOR) 40 MG tablet, Take 40 mg by mouth daily., Disp: , Rfl:  .  carvedilol (COREG) 6.25 MG tablet, Take 6.25 mg by mouth 2 (two) times daily with a meal. , Disp: , Rfl:  .  citalopram (CELEXA) 40 MG tablet, Take 40 mg by mouth every evening. , Disp: , Rfl:  .  clonazePAM (KLONOPIN) 0.5 MG tablet, Take 0.5 mg by mouth 2 (two) times daily. , Disp: , Rfl:  .  clopidogrel (PLAVIX) 75 MG tablet, Take 75 mg by mouth daily., Disp: , Rfl:  .  fenofibrate 160 MG tablet, Take 160 mg by mouth daily., Disp: , Rfl:  .  losartan (COZAAR) 25 MG tablet, Take 1 tablet (25 mg total) by mouth daily., Disp: 90 tablet, Rfl: 0 .  nebivolol (BYSTOLIC) 10 MG tablet, Take 10 mg by mouth daily., Disp: , Rfl:  .  nitroGLYCERIN (NITROSTAT) 0.4 MG SL tablet, Place 0.4 mg under the tongue every 5 (five) minutes as needed for chest pain., Disp: , Rfl:  .  Vitamin D3 (VITAMIN D) 25 MCG tablet, Take 1,000 Units by mouth daily., Disp: , Rfl:   Orders Placed This Encounter  Procedures  . Basic metabolic panel  . Magnesium  . CBC   --Continue cardiac medications as reconciled in final medication list. --Return in about 4 weeks (around 02/18/2020) for  post heart catheterization  follow up.. Or sooner if needed. --Continue follow-up with your primary care physician regarding the management of your other chronic comorbid conditions.  Patient's questions and concerns were addressed to her satisfaction. She voices understanding of the instructions provided during this encounter.   This note was created using a voice recognition software as a result there may be grammatical errors inadvertently enclosed that do not reflect the nature of this encounter. Every  attempt is made to correct such errors.  Tessa Lerner, [place], [facility]  Pager: [phone] Office: [phone]

## 2020-01-25 DIAGNOSIS — R0609 Other forms of dyspnea: Secondary | ICD-10-CM

## 2020-01-25 DIAGNOSIS — R9439 Abnormal result of other cardiovascular function study: Secondary | ICD-10-CM | POA: Diagnosis present

## 2020-01-26 ENCOUNTER — Ambulatory Visit (HOSPITAL_COMMUNITY)
Admission: RE | Admit: 2020-01-26 | Discharge: 2020-01-26 | Disposition: A | Discharge status: Home / Self Care | Payer: Medicare HMO | Attending: Cardiology | Admitting: Cardiology

## 2020-01-26 ENCOUNTER — Other Ambulatory Visit: Payer: Self-pay

## 2020-01-26 ENCOUNTER — Encounter (HOSPITAL_COMMUNITY)
Admission: RE | Disposition: A | Discharge status: Home / Self Care | Payer: Self-pay | Source: Home / Self Care | Attending: Cardiology

## 2020-01-26 DIAGNOSIS — Z87891 Personal history of nicotine dependence: Secondary | ICD-10-CM | POA: Diagnosis not present

## 2020-01-26 DIAGNOSIS — Z8673 Personal history of transient ischemic attack (TIA), and cerebral infarction without residual deficits: Secondary | ICD-10-CM | POA: Diagnosis not present

## 2020-01-26 DIAGNOSIS — Z7902 Long term (current) use of antithrombotics/antiplatelets: Secondary | ICD-10-CM | POA: Insufficient documentation

## 2020-01-26 DIAGNOSIS — Z7982 Long term (current) use of aspirin: Secondary | ICD-10-CM | POA: Diagnosis not present

## 2020-01-26 DIAGNOSIS — Z885 Allergy status to narcotic agent status: Secondary | ICD-10-CM | POA: Diagnosis not present

## 2020-01-26 DIAGNOSIS — R9439 Abnormal result of other cardiovascular function study: Secondary | ICD-10-CM | POA: Diagnosis present

## 2020-01-26 DIAGNOSIS — R9430 Abnormal result of cardiovascular function study, unspecified: Secondary | ICD-10-CM | POA: Insufficient documentation

## 2020-01-26 DIAGNOSIS — I447 Left bundle-branch block, unspecified: Secondary | ICD-10-CM | POA: Diagnosis not present

## 2020-01-26 DIAGNOSIS — Z79899 Other long term (current) drug therapy: Secondary | ICD-10-CM | POA: Insufficient documentation

## 2020-01-26 DIAGNOSIS — I2510 Atherosclerotic heart disease of native coronary artery without angina pectoris: Secondary | ICD-10-CM | POA: Diagnosis not present

## 2020-01-26 DIAGNOSIS — Z888 Allergy status to other drugs, medicaments and biological substances status: Secondary | ICD-10-CM | POA: Insufficient documentation

## 2020-01-26 DIAGNOSIS — M1990 Unspecified osteoarthritis, unspecified site: Secondary | ICD-10-CM | POA: Insufficient documentation

## 2020-01-26 DIAGNOSIS — I429 Cardiomyopathy, unspecified: Secondary | ICD-10-CM | POA: Insufficient documentation

## 2020-01-26 DIAGNOSIS — G40909 Epilepsy, unspecified, not intractable, without status epilepticus: Secondary | ICD-10-CM | POA: Diagnosis not present

## 2020-01-26 DIAGNOSIS — Z882 Allergy status to sulfonamides status: Secondary | ICD-10-CM | POA: Diagnosis not present

## 2020-01-26 DIAGNOSIS — R0609 Other forms of dyspnea: Secondary | ICD-10-CM

## 2020-01-26 DIAGNOSIS — I10 Essential (primary) hypertension: Secondary | ICD-10-CM | POA: Insufficient documentation

## 2020-01-26 DIAGNOSIS — E785 Hyperlipidemia, unspecified: Secondary | ICD-10-CM | POA: Insufficient documentation

## 2020-01-26 HISTORY — PX: LEFT HEART CATH AND CORONARY ANGIOGRAPHY: CATH118249

## 2020-01-26 SURGERY — LEFT HEART CATH AND CORONARY ANGIOGRAPHY
Anesthesia: LOCAL

## 2020-01-26 MED ORDER — SODIUM CHLORIDE 0.9% FLUSH: 3.0000 mL | INTRAVENOUS | Status: DC | PRN

## 2020-01-26 MED ORDER — VERAPAMIL HCL 2.5 MG/ML IV SOLN
INTRAVENOUS | Status: DC | PRN
  Administered 2020-01-26: 10 mL via INTRA_ARTERIAL

## 2020-01-26 MED ORDER — FENTANYL CITRATE (PF) 100 MCG/2ML IJ SOLN
INTRAMUSCULAR | Status: DC | PRN
  Administered 2020-01-26: 50 ug via INTRAVENOUS

## 2020-01-26 MED ORDER — LIDOCAINE HCL (PF) 1 % IJ SOLN
INTRAMUSCULAR | Status: AC
  Filled 2020-01-26: qty 30

## 2020-01-26 MED ORDER — ASPIRIN 81 MG PO CHEW
81.0000 mg | CHEWABLE_TABLET | ORAL | Status: AC
  Administered 2020-01-26: 81 mg via ORAL
  Filled 2020-01-26: qty 1

## 2020-01-26 MED ORDER — FENTANYL CITRATE (PF) 100 MCG/2ML IJ SOLN
INTRAMUSCULAR | Status: AC
  Filled 2020-01-26: qty 2

## 2020-01-26 MED ORDER — SODIUM CHLORIDE 0.9 % IV SOLN: INTRAVENOUS | Status: DC

## 2020-01-26 MED ORDER — IOHEXOL 350 MG/ML SOLN
INTRAVENOUS | Status: DC | PRN
  Administered 2020-01-26: 50 mL via INTRA_ARTERIAL

## 2020-01-26 MED ORDER — SODIUM CHLORIDE 0.9% FLUSH: 3.0000 mL | Freq: Two times a day (BID) | INTRAVENOUS | Status: DC

## 2020-01-26 MED ORDER — LIDOCAINE HCL (PF) 1 % IJ SOLN
INTRAMUSCULAR | Status: DC | PRN
  Administered 2020-01-26: 2 mL

## 2020-01-26 MED ORDER — CLOPIDOGREL BISULFATE 75 MG PO TABS
75.0000 mg | ORAL_TABLET | Freq: Once | ORAL | Status: AC
  Administered 2020-01-26: 75 mg via ORAL
  Filled 2020-01-26: qty 1

## 2020-01-26 MED ORDER — MIDAZOLAM HCL 2 MG/2ML IJ SOLN
INTRAMUSCULAR | Status: AC
  Filled 2020-01-26: qty 2

## 2020-01-26 MED ORDER — HYDRALAZINE HCL 20 MG/ML IJ SOLN
INTRAMUSCULAR | Status: AC
  Filled 2020-01-26: qty 1

## 2020-01-26 MED ORDER — HYDRALAZINE HCL 20 MG/ML IJ SOLN
INTRAMUSCULAR | Status: DC | PRN
  Administered 2020-01-26: 10 mg via INTRAVENOUS

## 2020-01-26 MED ORDER — HEPARIN (PORCINE) IN NACL 1000-0.9 UT/500ML-% IV SOLN
INTRAVENOUS | Status: DC | PRN
  Administered 2020-01-26 (×2): 500 mL

## 2020-01-26 MED ORDER — HEPARIN SODIUM (PORCINE) 1000 UNIT/ML IJ SOLN
INTRAMUSCULAR | Status: AC
  Filled 2020-01-26: qty 1

## 2020-01-26 MED ORDER — SODIUM CHLORIDE 0.9 % IV SOLN: 250.0000 mL | INTRAVENOUS | Status: DC | PRN

## 2020-01-26 MED ORDER — MIDAZOLAM HCL 2 MG/2ML IJ SOLN
INTRAMUSCULAR | Status: DC | PRN
  Administered 2020-01-26: 1 mg via INTRAVENOUS

## 2020-01-26 MED ORDER — VERAPAMIL HCL 2.5 MG/ML IV SOLN
INTRAVENOUS | Status: AC
  Filled 2020-01-26: qty 2

## 2020-01-26 MED ORDER — HEPARIN SODIUM (PORCINE) 1000 UNIT/ML IJ SOLN
INTRAMUSCULAR | Status: DC | PRN
  Administered 2020-01-26: 3000 [IU] via INTRAVENOUS

## 2020-01-26 SURGICAL SUPPLY — 12 items
CATH 5FR JL3.5 JR4 ANG PIG MP (CATHETERS) ×1 IMPLANT
CATH LAUNCHER 5F EBU3.0 (CATHETERS) IMPLANT
CATHETER LAUNCHER 5F EBU3.0 (CATHETERS) ×2
DEVICE RAD TR BAND REGULAR (VASCULAR PRODUCTS) ×1 IMPLANT
GLIDESHEATH SLEND A-KIT 6F 22G (SHEATH) ×1 IMPLANT
GUIDEWIRE INQWIRE 1.5J.035X260 (WIRE) IMPLANT
INQWIRE 1.5J .035X260CM (WIRE) ×2
KIT HEART LEFT (KITS) ×2 IMPLANT
PACK CARDIAC CATHETERIZATION (CUSTOM PROCEDURE TRAY) ×2 IMPLANT
TRANSDUCER W/STOPCOCK (MISCELLANEOUS) ×2 IMPLANT
TUBING CIL FLEX 10 FLL-RA (TUBING) ×2 IMPLANT
WIRE HI TORQ VERSACORE-J 145CM (WIRE) ×2 IMPLANT

## 2020-01-26 NOTE — H&P (Signed)
OV 01/21/2020 copied for documentation    Anna Salazar Date of Birth: October 21, 1940 MRN: [id number] Primary Care Provider:Moreira, Channing Mutters, MD Former Cardiology Providers: Dr. Yates Decamp Primary Cardiologist: Elder Negus, MD, [facility] (established care 01/11/2020)  Date: 01/21/20  No chief complaint on file.   HPI  Anna Salazar is a 79 y.o.  female who presents to the office with a chief complaint of " review test results." Patient's past medical history and cardiovascular risk factors include: hypertension, hyperlipidemia, previous history of TIA, chronic LBBBB, seizure disorder, very mild CAD by coronary angiogram in August 2011 that revealed mild multivessel CAD, former smoker, postmenopausal female, advanced age.  Patient is accompanied by her son (Ed) at today's office visit.  At the last office visit patient presented to the office for preoperative or stratification for upcoming right-sided total knee arthroplasty.  Patient was originally scheduled for surgery on January 12, 2020 but it was canceled as anesthesiology wanted cardiology restratification.  At the last office visit on January 11, 2020 patient noted that she was having shortness of breath with effort related activities, feeling fatigue, taking frequent breaks for the same activities as she was doing back in 2019.  As result she was recommended to undergo an echocardiogram and nuclear stress test.  Since last office visit patient had an echocardiogram which noted LVEF of 35% compared to 50% in 2019.  She also has moderate global and severe anterior hypokinesis on echocardiogram.  Her nuclear stress test also notes reversible ischemia in the inferior, septal and apical regions.  ALLERGIES: Allergies  Allergen Reactions  . Morphine And Related Other (See Comments)    AMS  . Sulfa Antibiotics Hives  . Tegretol [Carbamazepine] Hives   MEDICATION LIST PRIOR TO VISIT: No current facility-administered medications on file  prior to encounter.   Current Outpatient Medications on File Prior to Encounter  Medication Sig Dispense Refill  . acetaminophen (TYLENOL) 325 MG tablet Take 650 mg by mouth daily as needed for mild pain (headaches.).     Marland Kitchen aspirin EC 81 MG tablet Take 81 mg by mouth every evening.     Marland Kitchen atorvastatin (LIPITOR) 40 MG tablet Take 40 mg by mouth daily at 6 (six) AM.     . carvedilol (COREG) 6.25 MG tablet Take 6.25 mg by mouth 2 (two) times daily with a meal.     . citalopram (CELEXA) 40 MG tablet Take 40 mg by mouth daily at 6 (six) AM.     . clonazePAM (KLONOPIN) 0.5 MG tablet Take 0.5 mg by mouth 2 (two) times daily.     . clopidogrel (PLAVIX) 75 MG tablet Take 75 mg by mouth daily at 6 (six) AM.     . fenofibrate 160 MG tablet Take 160 mg by mouth daily.    . furosemide (LASIX) 40 MG tablet Take 40 mg by mouth daily.    Marland Kitchen losartan (COZAAR) 25 MG tablet Take 1 tablet (25 mg total) by mouth daily. (Patient taking differently: Take 25 mg by mouth every morning. ) 90 tablet 0  . nebivolol (BYSTOLIC) 5 MG tablet Take 5 mg by mouth every evening.    . nitroGLYCERIN (NITROSTAT) 0.4 MG SL tablet Place 0.4 mg under the tongue every 5 (five) minutes as needed for chest pain.    . potassium chloride SA (KLOR-CON) 20 MEQ tablet Take 20 mEq by mouth every evening.    . Vitamin D3 (VITAMIN D) 25 MCG tablet Take 1,000 Units by mouth daily.  PAST MEDICAL HISTORY: Past Medical History:  Diagnosis Date  . Arthritis   . Brittle bones   . Coronary artery disease   . Depression   . Headache   . Hyperlipidemia   . Hypertension   . Seizures (HCC)   . TIA (transient ischemic attack)     PAST SURGICAL HISTORY: Past Surgical History:  Procedure Laterality Date  . ABDOMINAL HYSTERECTOMY    . APPENDECTOMY    . CARDIAC CATHETERIZATION    . CHOLECYSTECTOMY    . FRACTURE SURGERY     ankle right  . PATELLA FRACTURE SURGERY     1964    FAMILY HISTORY: The patient's family history includes Cancer  in her sister.   SOCIAL HISTORY:  The patient  reports that she has quit smoking. She has never used smokeless tobacco. She reports that she does not drink alcohol and does not use drugs.  Review of Systems  Constitutional: Positive for malaise/fatigue. Negative for chills and fever.  HENT: Negative for hoarse voice and nosebleeds.   Eyes: Negative for discharge, double vision and pain.  Cardiovascular: Negative for chest pain, claudication, dyspnea on exertion, leg swelling, near-syncope, orthopnea, palpitations, paroxysmal nocturnal dyspnea and syncope.  Respiratory: Positive for shortness of breath. Negative for hemoptysis.   Musculoskeletal: Positive for joint pain. Negative for muscle cramps and myalgias.  Gastrointestinal: Negative for abdominal pain, constipation, diarrhea, hematemesis, hematochezia, melena, nausea and vomiting.  Neurological: Negative for dizziness and light-headedness.    PHYSICAL EXAM: Vitals with BMI 01/21/2020 01/11/2020 01/11/2020  Height 5\' 2"  - 5\' 2"   Weight 130 lbs 3 oz - 130 lbs  BMI 23.81 - 23.77  Systolic 154 158  Diastolic 64 73 72  Pulse 69 75 76    CONSTITUTIONAL: Elderly appearing female, ambulating with a cane, hemodynamically stable, no acute distress.   SKIN: Skin is warm and dry. No rash noted. No cyanosis. No pallor. No jaundice HEAD: Normocephalic and atraumatic.  EYES: No scleral icterus MOUTH/THROAT: Moist oral membranes.  NECK: No JVD present. No thyromegaly noted. No carotid bruits  LYMPHATIC: No visible cervical adenopathy.  CHEST Normal respiratory effort. No intercostal retractions  LUNGS: Clear to auscultation bilaterally.  No stridor. No wheezes. No rales.  CARDIOVASCULAR: Regular rate rhythm, positive S1-S2, no murmurs rubs or gallops appreciated ABDOMINAL: No apparent ascites.  EXTREMITIES: Trace bilateral pitting edema.  Warm to touch. HEMATOLOGIC: No significant bruising NEUROLOGIC: Oriented to person, place, and time.  Nonfocal. Normal muscle tone.  PSYCHIATRIC: Normal mood and affect. Normal behavior. Cooperative  CARDIAC DATABASE: EKG: 01/11/2020: Normal sinus rhythm, 85 bpm, left bundle branch block, poor R wave progression, without underlying injury pattern.   Echocardiogram: 12/27/2017: LVEF 50%, LV cavity size normal, abnormal septal motion due to LBBB, grade 1 diastolic impairment, mild MR, mild TR, no pulmonary hypertension.  01/14/2020: LVEF 35%, moderate global and severe anterior hypokinesis, mild LVH, grade 1 diastolic impairment, normal LAP, mild left atrial dilatation, mild to moderate AR, moderate MR, moderate TR, moderate pulmonary hypertension, RVSP 50 mmHg.  Stress Testing:  Lexiscan Tetrofosmin Stress Test  01/18/2020: Non-diagnostic ECG stress. Myocardial perfusion is abnormal. There is a moderate degree large extent defect suggestive of scar with superimposed reversible defect in the inferior, septal and apical regions.  Gated SPECT imaging of the left ventricle was abnormal,  demonstrating global hypokinesis. Severely enlarged left ventricle. Overall LV systolic function is abnormal without regional wall motion abnormalities. Global hypokinesis of the left ventricle. Stress LV EF: 23%.  High risk  study.  Compared to 12/16/2017 report, LVEF 34%, and no definite ischemia noted. Clinical correlation recommended.   Event Monitor  01/03/2018-02/01/2018:  Baseline heart rate normal sinus rhythm. 6 patient triggered events occurred without any reported symptoms correlating with normal sinus rhythm. When autodetect at event occurred correlating with normal sinus rhythm with artifact. Lowest bradycardic heart rate was 49 bpm on 6/12 at 2:44 PM. Fastest tachycardia event was 107 bpm on 6/95 10:39 AM. No A. fib or SVT was noted  LABORATORY DATA: CBC Latest Ref Rng & Units 01/05/2020 05/21/2012 05/20/2012  WBC 4.0 - 10.5 K/uL 5.4 7.4 7.6  Hemoglobin 12.0 - 15.0 g/dL 11.3(L) 11.4(L) 11.2(L)    Hematocrit 36 - 46 % 34.7(L) 34.2(L) 34.1(L)  Platelets 150 - 400 K/uL 208 221 224    CMP Latest Ref Rng & Units 01/05/2020 05/21/2012 05/20/2012  Glucose 70 - 99 mg/dL 85 83 88  BUN 8 - 23 mg/dL Creatinine 0.44 - 1.00 mg/dL 1.61(W) 9.60(A) 5.40(J)  Sodium 135 - 145 mmol/L 142 147(H) 142  Potassium 3.5 - 5.1 mmol/L 4.1 4.3 3.9  Chloride 98 - 111 mmol/L 109 114(H) 109  CO2 22 - 32 mmol/L Calcium 8.9 - 10.3 mg/dL 9.0 8.8 8.7  Total Protein 6.0 - 8.3 g/dL - - 5.3(L)  Total Bilirubin 0.3 - 1.2 mg/dL - - 0.4  Alkaline Phos 39 - 117 U/L - - 30(L)  AST 0 - 37 U/L - - 16  ALT 0 - 35 U/L - - 7    Lipid Panel     Component Value Date/Time   CHOL 105 05/20/2012 0630   TRIG 73 05/20/2012 0630   HDL 50 05/20/2012 0630   CHOLHDL 2.1 05/20/2012 0630   VLDL 15 05/20/2012 0630   LDLCALC 40 05/20/2012 0630    Lab Results  Component Value Date   HGBA1C 5.9 (H) 05/20/2012   No components found for: NTPROBNP Lab Results  Component Value Date   TSH 1.648 05/20/2012   TSH 0.698 03/07/2010    Cardiac Panel (last 3 results) No results for input(s): CKTOTAL, CKMB, TROPONINIHS, RELINDX in the last 72 hours.  IMPRESSION:  No diagnosis found.   RECOMMENDATIONS: Anna Salazar is a 79 y.o. female whose past medical history and cardiovascular risk factors include: hypertension, hyperlipidemia, previous history of TIA, chronic LBBBB, seizure disorder, very mild CAD by coronary angiogram in August 2011 that revealed mild multivessel CAD, former smoker, postmenopausal female, advanced age.  Abnormal nuclear stress test:  Given the change in clinical status and upcoming noncardiac surgery patient was recommended to undergo an ischemic evaluation.  Echocardiogram notes moderately reduced left ventricular systolic function with global and regional wall motion abnormalities.  Nuclear stress test results were independently reviewed with the patient and her son which notes a  reversible perfusion defect concerning for ischemia in the RCA/LCx distribution.  Continue dual antiplatelet therapy.  Continue carvedilol.  Continue statin therapy.  Continue fenofibrate.  We will add losartan 25 mg p.o. daily  Repeat blood work in 1 week to evaluate kidney function and electrolytes.  We will transition to [facility] once left heart catheterization is complete.  Patient has sublingual nitroglycerin tablets to use for as needed basis if needed.  The left heart catheterization procedure was explained to the patient in detail. The indication, alternatives, risks and benefits were reviewed. Complications including but not limited to bleeding, infection, acute kidney injury, blood transfusion, heart rhythm disturbances, contrast (dye) reaction,  damage to the arteries or nerves in the legs or hands, cerebrovascular accident, myocardial infarction, need for emergent bypass surgery, blood clots in the legs, possible need for emergent blood transfusion, and rarely death were reviewed and discussed with the patient. The patient voices understanding and wishes to proceed.   In the interim, if she has worsening shortness of breath with effort related activities she is recommended to go to the closest ER via EMS for further evaluation.  Plan of care discussed with both the patient and her son at today's office visit.  Cardiomyopathy, etiology unknown:  Continue current guideline directed medical therapy.  She will need to have her medications uptitrated to guideline directed directed medical therapy  Benign essential hypertension:  Start losartan 25 mg p.o. daily.  Continue her home medications.  Currently managed by per primary team.  Left bundle branch block: Chronic and stable.  Total encounter time 40 minutes.  FINAL MEDICATION LIST END OF ENCOUNTER: Meds ordered this encounter  Medications  . sodium chloride flush (NS) 0.9 % injection 3 mL  . sodium chloride flush (NS)  0.9 % injection 3 mL  . 0.9 %  sodium chloride infusion  . aspirin chewable tablet 81 mg  . 0.9 %  sodium chloride infusion     Current Facility-Administered Medications:  .  0.9 %  sodium chloride infusion, 250 mL, Intravenous, PRN, Raiden Haydu J, MD .  0.9 %  sodium chloride infusion, , Intravenous, Continuous, Darla Mcdonald J, MD .  Melene Muller ON 01/27/2020] aspirin chewable tablet 81 mg, 81 mg, Oral, Pre-Cath, Amity Roes J, MD .  sodium chloride flush (NS) 0.9 % injection 3 mL, 3 mL, Intravenous, Q12H, Saesha Llerenas J, MD .  sodium chloride flush (NS) 0.9 % injection 3 mL, 3 mL, Intravenous, PRN, Burgandy Hackworth J, MD  Orders Placed This Encounter  Procedures  . Diet NPO time specified  . Informed Consent Details: Physician/Practitioner Attestation; Transcribe to consent form and obtain patient signature  . Confirm CBC and BMP (or CMP) results within 7 days for inpatient and 30 days for outpatient: Outpatients with severe anemia (hgb<10, CKD, severe thrombocytopenia plts<100) labs should be within 10 days. Only draw PT/INR on patients that are on Coumadin, Hgb<10, have liver disease (cirrhosis, liver CA, hepatitis, etc). Urine pregnancy test within hospital admission for inpatients of child bearing age, for outpatients day of procedure.  . Confirm EKG performed within 30 days for cardiac procedures and 12 months for peripheral vascular procedures.  Place order for EKG if missing or not within timeframe.  . Verify aspirin and / or anti-platelet medication (Plavix, Effient, Brilinta) dose available for cardiac / peripheral vascular procedure day. IF ordered daily / once, adjust schedule to administer before procedure.  . Weigh patient  . Initiate Cath/PCI clinical path; encourage patient to watch CCTV video  . Clip R radial (no IV/bracelet R wrist)  . Clip R groin  . Insert peripheral IV  . Insert 2nd peripheral IV site-Saline lock IV   --Continue cardiac  medications as reconciled in final medication list. --No follow-ups on file. Or sooner if needed. --Continue follow-up with your primary care physician regarding the management of your other chronic comorbid conditions.  Patient's questions and concerns were addressed to her satisfaction. She voices understanding of the instructions provided during this encounter.   This note was created using a voice recognition software as a result there may be grammatical errors inadvertently enclosed that do not reflect the nature of this encounter.  Every attempt is made to correct such errors.  Tessa Lerner, [place], [facility]  Pager: [phone] Office: [phone]

## 2020-01-26 NOTE — Progress Notes (Signed)
Discharge instructions reviewed with pt and her son (via telephone) both voice understanding.  

## 2020-01-26 NOTE — Interval H&P Note (Signed)
History and Physical Interval Note:  01/26/2020 3:49 PM  Anna Salazar  has presented today for surgery, with the diagnosis of CAD, Hypertension.  The various methods of treatment have been discussed with the patient and family. After consideration of risks, benefits and other options for treatment, the patient has consented to  Procedure(s): LEFT HEART CATH AND CORONARY ANGIOGRAPHY (N/A) as a surgical intervention.  The patient's history has been reviewed, patient examined, no change in status, stable for surgery.  I have reviewed the patient's chart and labs.  Questions were answered to the patient's satisfaction.    2016/2017 Appropriate Use Criteria for Coronary Revascularization Clinical Presentation: Diabetes Mellitus? Symptom Status? S/P CABG? Antianginal Therapy (# of long-acting drugs)? Results of Non-invasive testing? FFR/iFR results in all diseased vessels? Patient undergoing renal transplant? Patient undergoing percutaneous valve procedure (TAVR, MitraClip, Others)? Symptom Status:  Ischemic Symptoms  Non-invasive Testing:  High risk  If no or indeterminate stress test, FFR/iFR results in all diseased vessels:  N/A  Diabetes Mellitus:  No  S/P CABG:  No  Antianginal therapy (number of long-acting drugs):  1  Patient undergoing renal transplant:  No  Patient undergoing percutaneous valve procedure:  No    newline 1 Vessel Disease PCI CABG  No proximal LAD involvement, No proximal left dominant LCX involvement M (6); Indication 2 M (4); Indication 2   Proximal left dominant LCX involvement A (7); Indication 5 A (7); Indication 5   Proximal LAD involvement A (7); Indication 5 A (7); Indication 5   newline 2 Vessel Disease  No proximal LAD involvement A (7); Indication 8 M (6); Indication 8   Proximal LAD involvement A (7); Indication 11 A (7); Indication 11   newline 3 Vessel Disease  Low disease complexity (e.g., focal stenoses, SYNTAX <=22) A (7); Indication 17 A (8);  Indication 17   Intermediate or high disease complexity (e.g., SYNTAX >=23) M (6); Indication 21 A (8); Indication 21   newline Left Main Disease  Isolated LMCA disease: ostial or midshaft A (7); Indication 24 A (9); Indication 24   Isolated LMCA disease: bifurcation involvement M (5); Indication 25 A (9); Indication 25   LMCA ostial or midshaft, concurrent low disease burden multivessel disease (e.g., 1-2 additional focal stenoses, SYNTAX <=22) A (7); Indication 26 A (9); Indication 26   LMCA ostial or midshaft, concurrent intermediate or high disease burden multivessel disease (e.g., 1-2 additional bifurcation stenoses, long stenoses, SYNTAX >=23) M (4); Indication 27 A (9); Indication 27   LMCA bifurcation involvement, concurrent low disease burden multivessel disease (e.g., 1-2 additional focal stenoses, SYNTAX <=22) M (5); Indication 28 A (9); Indication 28   LMCA bifurcation involvement, concurrent intermediate or high disease burden multivessel disease (e.g., 1-2 additional bifurcation stenoses, long stenoses, SYNTAX >=23) R (3); Indication 29 A (9); Indication 29     Selby Foisy J Bellah Alia

## 2020-01-26 NOTE — Discharge Instructions (Signed)
Radial Site Care  This sheet gives you information about how to care for yourself after your procedure. Your health care provider may also give you more specific instructions. If you have problems or questions, contact your health care provider. What can I expect after the procedure? After the procedure, it is common to have:  Bruising and tenderness at the catheter insertion area. Follow these instructions at home: Medicines  Take over-the-counter and prescription medicines only as told by your health care provider. Insertion site care  Follow instructions from your health care provider about how to take care of your insertion site. Make sure you: ? Wash your hands with soap and water before you change your bandage (dressing). If soap and water are not available, use hand sanitizer. ? Change your dressing as told by your health care provider. ? Leave stitches (sutures), skin glue, or adhesive strips in place. These skin closures may need to stay in place for 2 weeks or longer. If adhesive strip edges start to loosen and curl up, you may trim the loose edges. Do not remove adhesive strips completely unless your health care provider tells you to do that.  Check your insertion site every day for signs of infection. Check for: ? Redness, swelling, or pain. ? Fluid or blood. ? Pus or a bad smell. ? Warmth.  Do not take baths, swim, or use a hot tub until your health care provider approves.  You may shower 24-48 hours after the procedure, or as directed by your health care provider. ? Remove the dressing and gently wash the site with plain soap and water. ? Pat the area dry with a clean towel. ? Do not rub the site. That could cause bleeding.  Do not apply powder or lotion to the site. Activity   For 24 hours after the procedure, or as directed by your health care provider: ? Do not flex or bend the affected arm. ? Do not push or pull heavy objects with the affected arm. ? Do not  drive yourself home from the hospital or clinic. You may drive 24 hours after the procedure unless your health care provider tells you not to. ? Do not operate machinery or power tools.  Do not lift anything that is heavier than 10 lb (4.5 kg), or the limit that you are told, until your health care provider says that it is safe.  Ask your health care provider when it is okay to: ? Return to work or school. ? Resume usual physical activities or sports. ? Resume sexual activity. General instructions  If the catheter site starts to bleed, raise your arm and put firm pressure on the site. If the bleeding does not stop, get help right away. This is a medical emergency.  If you went home on the same day as your procedure, a responsible adult should be with you for the first 24 hours after you arrive home.  Keep all follow-up visits as told by your health care provider. This is important. Contact a health care provider if:  You have a fever.  You have redness, swelling, or yellow drainage around your insertion site. Get help right away if:  You have unusual pain at the radial site.  The catheter insertion area swells very fast.  The insertion area is bleeding, and the bleeding does not stop when you hold steady pressure on the area.  Your arm or hand becomes pale, cool, tingly, or numb. These symptoms may represent a serious problem   that is an emergency. Do not wait to see if the symptoms will go away. Get medical help right away. Call your local emergency services (911 in the [place] Do not drive yourself to the hospital. Summary  After the procedure, it is common to have bruising and tenderness at the site.  Follow instructions from your health care provider about how to take care of your radial site wound. Check the wound every day for signs of infection.  Do not lift anything that is heavier than 10 lb (4.5 kg), or the limit that you are told, until your health care provider says  that it is safe. This information is not intended to replace advice given to you by your health care provider. Make sure you discuss any questions you have with your health care provider. Document Revised: 08/21/2017 Document Reviewed: 08/21/2017 Elsevier Patient Education  2020 [employer]  

## 2020-01-27 ENCOUNTER — Encounter (HOSPITAL_COMMUNITY): Payer: Self-pay | Admitting: Cardiology

## 2020-01-30 LAB — BASIC METABOLIC PANEL
BUN/Creatinine Ratio: 13 (ref 12–28)
BUN: 16 mg/dL (ref 8–27)
CO2: 22 mmol/L (ref 20–29)
Calcium: 9.4 mg/dL (ref 8.7–10.3)
Chloride: 107 mmol/L — ABNORMAL HIGH (ref 96–106)
Creatinine, Ser: 1.2 mg/dL — ABNORMAL HIGH (ref 0.57–1.00)
GFR calc Af Amer: 49 mL/min/{1.73_m2} — ABNORMAL LOW (ref >59)
GFR calc non Af Amer: 42 mL/min/{1.73_m2} — ABNORMAL LOW (ref >59)
Glucose: 80 mg/dL (ref 65–99)
Potassium: 4.3 mmol/L (ref 3.5–5.2)
Sodium: 141 mmol/L (ref 134–144)

## 2020-01-30 LAB — CBC
Hematocrit: 37.4 % (ref 34.0–46.6)
Hemoglobin: 12.6 g/dL (ref 11.1–15.9)
MCH: 30.7 pg (ref 26.6–33.0)
MCHC: 33.7 g/dL (ref 31.5–35.7)
MCV: 91 fL (ref 79–97)
Platelets: 216 10*3/uL (ref 150–450)
RBC: 4.11 x10E6/uL (ref 3.77–5.28)
RDW: 14.5 % (ref 11.7–15.4)
WBC: 5.8 10*3/uL (ref 3.4–10.8)

## 2020-01-30 LAB — MAGNESIUM: Magnesium: 1.5 mg/dL — ABNORMAL LOW (ref 1.6–2.3)

## 2020-02-01 ENCOUNTER — Other Ambulatory Visit: Payer: Self-pay | Admitting: Cardiology

## 2020-02-01 MED ORDER — MAGNESIUM OXIDE 400 MG PO CAPS: 400.0000 mg | ORAL_CAPSULE | Freq: Three times a day (TID) | ORAL | 2 refills | Status: AC

## 2020-02-04 ENCOUNTER — Telehealth: Payer: Self-pay

## 2020-02-04 NOTE — Telephone Encounter (Signed)
Discussed results with patient she verbalized understanding. 

## 2020-02-04 NOTE — Telephone Encounter (Signed)
-----   Message from [place], [place] sent at 02/01/2020 11:17 AM EDT ----- Have the patient increase her fluid intake by 2 glasses/day. Start magnesium oxide 400 mg p.o. 3 times daily Blood work in 1 week to reevaluate kidney function and electrolytes, after the medication changes.

## 2020-02-13 ENCOUNTER — Other Ambulatory Visit: Payer: Self-pay | Admitting: Cardiology

## 2020-02-13 DIAGNOSIS — I429 Cardiomyopathy, unspecified: Secondary | ICD-10-CM

## 2020-02-13 LAB — BASIC METABOLIC PANEL
BUN/Creatinine Ratio: 20 (ref 12–28)
BUN: 25 mg/dL (ref 8–27)
CO2: 23 mmol/L (ref 20–29)
Calcium: 9.8 mg/dL (ref 8.7–10.3)
Chloride: 103 mmol/L (ref 96–106)
Creatinine, Ser: 1.28 mg/dL — ABNORMAL HIGH (ref 0.57–1.00)
GFR calc Af Amer: 45 mL/min/{1.73_m2} — ABNORMAL LOW (ref >59)
GFR calc non Af Amer: 39 mL/min/{1.73_m2} — ABNORMAL LOW (ref >59)
Glucose: 87 mg/dL (ref 65–99)
Potassium: 4.7 mmol/L (ref 3.5–5.2)
Sodium: 140 mmol/L (ref 134–144)

## 2020-02-13 LAB — MAGNESIUM: Magnesium: 2 mg/dL (ref 1.6–2.3)

## 2020-02-16 ENCOUNTER — Telehealth: Payer: Self-pay

## 2020-02-16 NOTE — Telephone Encounter (Signed)
-----   Message from [place], [place] sent at 02/13/2020 10:13 PM EDT ----- Please decrease Lasix to 20 mg p.o. daily. Please have her repeat blood work on Friday February 19 2020.

## 2020-02-16 NOTE — Telephone Encounter (Signed)
Called pt to inform her about her lab results. And to decrease her Lasix to 20mg  qd. And to repeat her labs on Friday. Pt understood.

## 2020-02-20 LAB — MAGNESIUM: Magnesium: 1.6 mg/dL (ref 1.6–2.3)

## 2020-02-20 LAB — BASIC METABOLIC PANEL
BUN/Creatinine Ratio: 16 (ref 12–28)
BUN: 20 mg/dL (ref 8–27)
CO2: 24 mmol/L (ref 20–29)
Calcium: 9.5 mg/dL (ref 8.7–10.3)
Chloride: 105 mmol/L (ref 96–106)
Creatinine, Ser: 1.22 mg/dL — ABNORMAL HIGH (ref 0.57–1.00)
GFR calc Af Amer: 48 mL/min/{1.73_m2} — ABNORMAL LOW (ref >59)
GFR calc non Af Amer: 42 mL/min/{1.73_m2} — ABNORMAL LOW (ref >59)
Glucose: 148 mg/dL — ABNORMAL HIGH (ref 65–99)
Potassium: 3.8 mmol/L (ref 3.5–5.2)
Sodium: 140 mmol/L (ref 134–144)

## 2020-02-20 LAB — PRO B NATRIURETIC PEPTIDE: NT-Pro BNP: 1143 pg/mL — ABNORMAL HIGH (ref 0–738)

## 2020-02-22 ENCOUNTER — Ambulatory Visit: Payer: Medicare HMO | Admitting: Cardiology

## 2020-02-22 ENCOUNTER — Other Ambulatory Visit: Payer: Self-pay

## 2020-02-22 ENCOUNTER — Encounter: Payer: Self-pay | Admitting: Cardiology

## 2020-02-22 VITALS — BP 143/59 | HR 65 | Resp 17 | Ht 62.0 in | Wt 125.0 lb

## 2020-02-22 DIAGNOSIS — I10 Essential (primary) hypertension: Secondary | ICD-10-CM

## 2020-02-22 DIAGNOSIS — E782 Mixed hyperlipidemia: Secondary | ICD-10-CM

## 2020-02-22 DIAGNOSIS — I5023 Acute on chronic systolic (congestive) heart failure: Secondary | ICD-10-CM

## 2020-02-22 DIAGNOSIS — I428 Other cardiomyopathies: Secondary | ICD-10-CM

## 2020-02-22 DIAGNOSIS — I447 Left bundle-branch block, unspecified: Secondary | ICD-10-CM

## 2020-02-22 DIAGNOSIS — Z8673 Personal history of transient ischemic attack (TIA), and cerebral infarction without residual deficits: Secondary | ICD-10-CM

## 2020-02-22 DIAGNOSIS — Z87891 Personal history of nicotine dependence: Secondary | ICD-10-CM

## 2020-02-22 MED ORDER — ENTRESTO 49-51 MG PO TABS: 1.0000 | ORAL_TABLET | Freq: Two times a day (BID) | ORAL | 0 refills | Status: DC

## 2020-02-22 NOTE — Progress Notes (Signed)
[name] Date of Birth: 04-16-1941 MRN: [id number] Primary Care Provider:Moreira, Channing Mutters, MD Former Cardiology Providers: Dr. Yates Decamp Primary Cardiologist: Tessa Lerner, DO, [facility] (established care 01/11/2020)  Date: 02/22/20  Chief Complaint  Patient presents with  . Cardiomyopathy  . Follow-up  . Results    cath    HPI  Anna Salazar is a 79 y.o.  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  Patient is accompanied by her son (Anna Salazar) at today's office visit.  Patient was originally referred for evaluation 2021 for preoperative or stratification.  However because patient was not able to do functional activity up to 4 METS and was feeling more tired and fatigued she was recommended to undergo an echo and stress test.  The echocardiogram noted LVEF of 35% compared to 50% in 2019 and nuclear stress test showed reversible ischemia.  At the last office visit patient was recommended to undergo left heart catheterization.  She did undergo left heart catheterization by Dr. Truett Mainland on January 26, 2020 and was found to have nonobstructive CAD with LVEF per LV gram of 35-45%.  Patient presents to the office for further management.   Since last office visit patient states that her dyspnea on exertion is stable.  She does experience to have longer more energy than before.  She continues to have 4 pillow orthopnea and PND at times.  She does not have lower extremity swelling.  She has not required the use of sublingual nitroglycerin tablets since left heart catheterization.  ALLERGIES: Allergies  Allergen  Reactions  . Morphine And Related Other (See Comments)    AMS  . Sulfa Antibiotics Hives  . Tegretol [Carbamazepine] Hives   MEDICATION LIST PRIOR TO VISIT: Current Outpatient Medications on File Prior to Visit  Medication Sig Dispense Refill  . acetaminophen (TYLENOL) 325 MG tablet Take 650 mg by mouth daily as needed for mild pain (headaches.).     Marland Kitchen aspirin EC 81 MG tablet Take 81 mg by mouth every evening.     Marland Kitchen atorvastatin (LIPITOR) 40 MG tablet Take 40 mg by mouth at bedtime.    . carvedilol (COREG) 6.25 MG tablet Take 6.25 mg by mouth 2 (two) times daily with a meal.     . citalopram (CELEXA) 40 MG tablet Take 40 mg by mouth daily at 6 (six) AM.     . clonazePAM (KLONOPIN) 0.5 MG tablet Take 0.5 mg by mouth 2 (two) times daily.     . clopidogrel (PLAVIX) 75 MG tablet Take 75 mg by mouth daily at 6 (six) AM.     . fenofibrate 160 MG tablet Take 160 mg by mouth daily.    . Magnesium Oxide 400 MG CAPS Take 1 capsule (400 mg total) by mouth in the morning, at noon, and at bedtime. 90 capsule 2  . nitroGLYCERIN (NITROSTAT) 0.4 MG SL tablet Place 0.4 mg under the tongue every 5 (five) minutes as needed for chest pain.    . potassium chloride SA (KLOR-CON) 20 MEQ tablet Take 20 mEq by mouth every evening.    . Vitamin D3 (VITAMIN D) 25 MCG tablet Take 1,000 Units by mouth daily.     No current facility-administered  medications on file prior to visit.    PAST MEDICAL HISTORY: Past Medical History:  Diagnosis Date  . Arthritis   . Brittle bones   . Coronary artery disease   . Depression   . Headache   . Hyperlipidemia   . Hypertension   . Seizures (HCC)   . TIA (transient ischemic attack)     PAST SURGICAL HISTORY: Past Surgical History:  Procedure Laterality Date  . ABDOMINAL HYSTERECTOMY    . APPENDECTOMY    . CARDIAC CATHETERIZATION    . CHOLECYSTECTOMY    . FRACTURE SURGERY     ankle right  . LEFT HEART CATH AND CORONARY ANGIOGRAPHY N/A 01/26/2020   Procedure: LEFT  HEART CATH AND CORONARY ANGIOGRAPHY;  Surgeon: Elder Negus, MD;  Location: [facility];  Service: Cardiovascular;  Laterality: N/A;  . PATELLA FRACTURE SURGERY     1964    FAMILY HISTORY: The patient's family history includes Cancer in her sister.   SOCIAL HISTORY:  The patient  reports that she quit smoking about 55 years ago. Her smoking use included cigarettes. She has a 0.13 pack-year smoking history. She has never used smokeless tobacco. She reports that she does not drink alcohol and does not use drugs.  Review of Systems  Constitutional: Positive for malaise/fatigue. Negative for chills and fever.  HENT: Negative for hoarse voice and nosebleeds.   Eyes: Negative for discharge, double vision and pain.  Cardiovascular: Positive for dyspnea on exertion, orthopnea and paroxysmal nocturnal dyspnea. Negative for chest pain, claudication, leg swelling, near-syncope, palpitations and syncope.  Respiratory: Positive for shortness of breath. Negative for hemoptysis.   Musculoskeletal: Positive for joint pain. Negative for muscle cramps and myalgias.  Gastrointestinal: Negative for abdominal pain, constipation, diarrhea, hematemesis, hematochezia, melena, nausea and vomiting.  Neurological: Negative for dizziness and light-headedness.    PHYSICAL EXAM: Vitals with BMI 02/22/2020 01/26/2020 01/26/2020  Height 5\' 2"  - -  Weight 125 lbs - -  BMI 22.86 - -  Systolic 143 138 -  Diastolic 59 58 -  Pulse 65 70 72    CONSTITUTIONAL: Elderly appearing female, ambulating with a cane, hemodynamically stable, no acute distress.   SKIN: Skin is warm and dry. No rash noted. No cyanosis. No pallor. No jaundice HEAD: Normocephalic and atraumatic.  EYES: No scleral icterus MOUTH/THROAT: Moist oral membranes.  NECK: No JVD present. No thyromegaly noted. No carotid bruits  LYMPHATIC: No visible cervical adenopathy.  CHEST Normal respiratory effort. No intercostal retractions  LUNGS:  Clear to auscultation bilaterally.  No stridor. No wheezes. No rales.  CARDIOVASCULAR: Regular rate rhythm, positive S1-S2, no murmurs rubs or gallops appreciated ABDOMINAL: No apparent ascites.  EXTREMITIES: Trace bilateral pitting edema.  Warm to touch. HEMATOLOGIC: No significant bruising NEUROLOGIC: Oriented to person, place, and time. Nonfocal. Normal muscle tone.  PSYCHIATRIC: Normal mood and affect. Normal behavior. Cooperative  CARDIAC DATABASE: EKG: 01/11/2020: Normal sinus rhythm, 85 bpm, left bundle branch block, poor R wave progression, without underlying injury pattern.   Echocardiogram: 12/27/2017: LVEF 50%, LV cavity size normal, abnormal septal motion due to LBBB, grade 1 diastolic impairment, mild MR, mild TR, no pulmonary hypertension.  01/14/2020: LVEF 35%, moderate global and severe anterior hypokinesis, mild LVH, grade 1 diastolic impairment, normal LAP, mild left atrial dilatation, mild to moderate AR, moderate MR, moderate TR, moderate pulmonary hypertension, RVSP 50 mmHg.  Stress Testing:  Lexiscan Tetrofosmin Stress Test  01/18/2020: Non-diagnostic ECG stress. Myocardial perfusion is abnormal. There is a moderate  degree large extent defect suggestive of scar with superimposed reversible defect in the inferior, septal and apical regions.  Gated SPECT imaging of the left ventricle was abnormal,  demonstrating global hypokinesis. Severely enlarged left ventricle. Overall LV systolic function is abnormal without regional wall motion abnormalities. Global hypokinesis of the left ventricle. Stress LV EF: 23%.  High risk study.  Compared to 12/16/2017 report, LVEF 34%, and no definite ischemia noted. Clinical correlation recommended.   Left heart catheterization by Dr. Evlyn Clines Patwardhan 01/26/2020:  LM: Normal  LAD: Minimal luminal irregularities  LCx: High OM1. Minimal luminal irregularities  RCA: Minimal luminal irregularities  The left ventricular ejection fraction is  35-45% by visual estimate.  Normal LVEDP   Event Monitor  01/03/2018-02/01/2018:  Baseline heart rate normal sinus rhythm. 6 patient triggered events occurred without any reported symptoms correlating with normal sinus rhythm. When autodetect at event occurred correlating with normal sinus rhythm with artifact. Lowest bradycardic heart rate was 49 bpm on 6/12 at 2:44 PM. Fastest tachycardia event was 107 bpm on 6/95 10:39 AM. No A. fib or SVT was noted  LABORATORY DATA: CBC Latest Ref Rng & Units 01/29/2020 01/05/2020 05/21/2012  WBC 3.4 - 10.8 x10E3/uL 5.8 5.4 7.4  Hemoglobin 11.1 - 15.9 g/dL 65.9 11.3(L) 11.4(L)  Hematocrit 34.0 - 46.6 % 37.4 34.7(L) 34.2(L)  Platelets 150 - 450 x10E3/uL 216 208 221    CMP Latest Ref Rng & Units 02/19/2020 02/12/2020 01/29/2020  Glucose 65 - 99 mg/dL 935(T) 87 80  BUN 8 - 27 mg/dL 20 25 16   Creatinine 0.57 - 1.00 mg/dL ) 0.17(B) 9.39(Q)  Sodium 134 - 144 mmol/L 140 140 141  Potassium 3.5 - 5.2 mmol/L 3.8 4.7 4.3  Chloride 96 - 106 mmol/L 105 103 107(H)  CO2 20 - 29 mmol/L 24 23 22   Calcium 8.7 - 10.3 mg/dL 9.5 9.8 9.4  Total Protein 6.0 - 8.3 g/dL - - -  Total Bilirubin 0.3 - 1.2 mg/dL - - -  Alkaline Phos 39 - 117 U/L - - -  AST 0 - 37 U/L - - -  ALT 0 - 35 U/L - - -    Lipid Panel     Component Value Date/Time   CHOL 105 05/20/2012 0630   TRIG 73 05/20/2012 0630   HDL 50 05/20/2012 0630   CHOLHDL 2.1 05/20/2012 0630   VLDL 15 05/20/2012 0630   LDLCALC 40 05/20/2012 0630    Lab Results  Component Value Date   HGBA1C 5.9 (H) 05/20/2012   No components found for: NTPROBNP Lab Results  Component Value Date   TSH 1.648 05/20/2012   TSH 0.698 03/07/2010    Cardiac Panel (last 3 results) No results for input(s): CKTOTAL, CKMB, TROPONINIHS, RELINDX in the last 72 hours.  IMPRESSION:    ICD-10-CM   1. Acute on chronic HFrEF (heart failure with reduced ejection fraction) (HCC)  I50.23 sacubitril-valsartan (ENTRESTO) 49-51 MG    Basic  metabolic panel    Magnesium    Pro b natriuretic peptide (BNP)    Pro b natriuretic peptide (BNP)    Magnesium    Basic metabolic panel  2. Nonischemic cardiomyopathy (HCC)  I42.8 sacubitril-valsartan (ENTRESTO) 49-51 MG    Basic metabolic panel    Magnesium    Pro b natriuretic peptide (BNP)    Pro b natriuretic peptide (BNP)    Magnesium    Basic metabolic panel  3. Benign hypertension  I10   4. Mixed hyperlipidemia  E78.2  5. LBBB (left bundle branch block)  I44.7   6. Former smoker  Z87.891   7. History of TIA (transient ischemic attack)  Z86.73      RECOMMENDATIONS: ZARRAH LOVELAND is a 79 y.o. 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  Acute on chronic heart failure with reduced EF, stage B, NYHA class II/III:  Medications reconciled.  Patient was on Bystolic and carvedilol.  Patient is asked to stop Bystolic.  Patient is currently on magnesium supplements twice a day.  Since her magnesium levels were on the lower limit of normal patient recommended to take it 3 times a day.  Discontinue losartan and Lasix  Start Entresto 49/51 mg p.o. twice daily repeat blood work in 1 week to evaluate kidney function electrolytes.  I will see her back in 2 weeks for further medication titration.  Would like to optimize Entresto prior to initiation of spironolactone.  Nonischemic cardiomyopathy: See above  History of TIA: Currently on dual antiplatelet therapy.  Currently on statin therapy.  Benign essential hypertension:  Start losartan 25 mg p.o. daily.  Continue her home medications.  Currently managed by per primary team.  Left bundle branch block: Chronic and stable.   FINAL MEDICATION LIST END OF  ENCOUNTER: Meds ordered this encounter  Medications  . sacubitril-valsartan (ENTRESTO) 49-51 MG    Sig: Take 1 tablet by mouth 2 (two) times daily.    Dispense:  60 tablet    Refill:  0   Medications Discontinued During This Encounter  Medication Reason  . furosemide (LASIX) 40 MG tablet Change in therapy  . losartan (COZAAR) 25 MG tablet Change in therapy  . nebivolol (BYSTOLIC) 5 MG tablet Patient Preference     Current Outpatient Medications:  .  acetaminophen (TYLENOL) 325 MG tablet, Take 650 mg by mouth daily as needed for mild pain (headaches.). , Disp: , Rfl:  .  aspirin EC 81 MG tablet, Take 81 mg by mouth every evening. , Disp: , Rfl:  .  atorvastatin (LIPITOR) 40 MG tablet, Take 40 mg by mouth at bedtime., Disp: , Rfl:  .  carvedilol (COREG) 6.25 MG tablet, Take 6.25 mg by mouth 2 (two) times daily with a meal. , Disp: , Rfl:  .  citalopram (CELEXA) 40 MG tablet, Take 40 mg by mouth daily at 6 (six) AM. , Disp: , Rfl:  .  clonazePAM (KLONOPIN) 0.5 MG tablet, Take 0.5 mg by mouth 2 (two) times daily. , Disp: , Rfl:  .  clopidogrel (PLAVIX) 75 MG tablet, Take 75 mg by mouth daily at 6 (six) AM. , Disp: , Rfl:  .  fenofibrate 160 MG tablet, Take 160 mg by mouth daily., Disp: , Rfl:  .  Magnesium Oxide 400 MG CAPS, Take 1 capsule (400 mg total) by mouth in the morning, at noon, and at bedtime., Disp: 90 capsule, Rfl: 2 .  nitroGLYCERIN (NITROSTAT) 0.4 MG SL tablet, Place 0.4 mg under the tongue every 5 (five) minutes as needed for chest pain., Disp: , Rfl:  .  potassium chloride SA (KLOR-CON) 20 MEQ tablet, Take 20 mEq by mouth every evening., Disp: , Rfl:  .  Vitamin D3 (VITAMIN D) 25 MCG tablet, Take 1,000 Units by mouth daily., Disp: , Rfl:  .  sacubitril-valsartan (ENTRESTO) 49-51 MG, Take 1 tablet  by mouth 2 (two) times daily., Disp: 60 tablet, Rfl: 0  Orders Placed This Encounter  Procedures  . Basic metabolic panel  . Magnesium  . Pro b natriuretic peptide (BNP)    --Continue cardiac medications as reconciled in final medication list. --Return in about 2 weeks (around 03/07/2020) for heart failure management.. Or sooner if needed. --Continue follow-up with your primary care physician regarding the management of your other chronic comorbid conditions.  Patient's questions and concerns were addressed to her satisfaction. She voices understanding of the instructions provided during this encounter.   This note was created using a voice recognition software as a result there may be grammatical errors inadvertently enclosed that do not reflect the nature of this encounter. Every attempt is made to correct such errors.  Tessa Lerner, [place], [facility]  Pager: [phone] Office: [phone]

## 2020-03-02 LAB — BASIC METABOLIC PANEL
BUN/Creatinine Ratio: 15 (ref 12–28)
BUN: 20 mg/dL (ref 8–27)
CO2: 23 mmol/L (ref 20–29)
Calcium: 9.2 mg/dL (ref 8.7–10.3)
Chloride: 106 mmol/L (ref 96–106)
Creatinine, Ser: 1.34 mg/dL — ABNORMAL HIGH (ref 0.57–1.00)
GFR calc Af Amer: 43 mL/min/{1.73_m2} — ABNORMAL LOW (ref >59)
GFR calc non Af Amer: 37 mL/min/{1.73_m2} — ABNORMAL LOW (ref >59)
Glucose: 80 mg/dL (ref 65–99)
Potassium: 4.5 mmol/L (ref 3.5–5.2)
Sodium: 140 mmol/L (ref 134–144)

## 2020-03-02 LAB — PRO B NATRIURETIC PEPTIDE: NT-Pro BNP: 504 pg/mL (ref 0–738)

## 2020-03-02 LAB — MAGNESIUM: Magnesium: 1.9 mg/dL (ref 1.6–2.3)

## 2020-03-07 NOTE — Progress Notes (Signed)
Spoke with patient, patient voiced understanding.

## 2020-03-10 ENCOUNTER — Encounter: Payer: Self-pay | Admitting: Cardiology

## 2020-03-10 ENCOUNTER — Other Ambulatory Visit: Payer: Self-pay

## 2020-03-10 ENCOUNTER — Ambulatory Visit: Payer: Medicare HMO | Admitting: Cardiology

## 2020-03-10 VITALS — BP 125/57 | HR 73 | Resp 16 | Ht 62.0 in | Wt 124.6 lb

## 2020-03-10 DIAGNOSIS — I428 Other cardiomyopathies: Secondary | ICD-10-CM

## 2020-03-10 DIAGNOSIS — I10 Essential (primary) hypertension: Secondary | ICD-10-CM

## 2020-03-10 DIAGNOSIS — Z8673 Personal history of transient ischemic attack (TIA), and cerebral infarction without residual deficits: Secondary | ICD-10-CM

## 2020-03-10 DIAGNOSIS — I5023 Acute on chronic systolic (congestive) heart failure: Secondary | ICD-10-CM

## 2020-03-10 DIAGNOSIS — Z87891 Personal history of nicotine dependence: Secondary | ICD-10-CM

## 2020-03-10 DIAGNOSIS — E782 Mixed hyperlipidemia: Secondary | ICD-10-CM

## 2020-03-10 DIAGNOSIS — I447 Left bundle-branch block, unspecified: Secondary | ICD-10-CM

## 2020-03-10 NOTE — Progress Notes (Signed)
Anna Salazar Date of Birth: 1941-04-06 MRN: [id number] Primary Care Provider:Moreira, Channing Mutters, MD Former Cardiology Providers: Dr. Yates Decamp Primary Cardiologist: Tessa Lerner, DO, [facility] (established care 01/11/2020)  Date: 03/10/20 Last Office Visit: 02/22/2020  Chief Complaint  Patient presents with  . HFrEF  . Follow-up    HPI  Anna Salazar is a 79 y.o.  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  Patient is accompanied by her son (Ed) at today's office visit.  Patient was originally referred for evaluation 2021 for preoperative or stratification.  However because patient was not able to do functional activity up to 4 METS and was feeling more tired and fatigued she was recommended to undergo an echo and stress test.  The echocardiogram noted LVEF of 35% compared to 50% in 2019 and nuclear stress test showed reversible ischemia.  At the last office visit patient was recommended to undergo left heart catheterization.  She did undergo left heart catheterization by Dr. Truett Mainland on January 26, 2020 and was found to have nonobstructive CAD with LVEF per LV gram of 35-45%.    Patient was started on heart failure medications in July 2021.  Since last office visit patient's tolerated the Entresto well without any side effects or intolerances.  Patient continues to have effort related dyspnea but her overall endurance has increased.  She is able to get more work done on the house but still gets tired at the end of the day.  She continues to have 4 pillow orthopnea and denies PND.  She does not have any lower extremity swelling.  No use of  sublingual nitroglycerin since last office visit.  According to the patient's son she is doing well overall.  ALLERGIES: Allergies  Allergen Reactions  . Morphine And Related Other (See Comments)    AMS  . Sulfa Antibiotics Hives  . Tegretol [Carbamazepine] Hives   MEDICATION LIST PRIOR TO VISIT: Current Outpatient Medications on File Prior to Visit  Medication Sig Dispense Refill  . aspirin EC 81 MG tablet Take 81 mg by mouth every evening.     Marland Kitchen atorvastatin (LIPITOR) 40 MG tablet Take 40 mg by mouth at bedtime.    . carvedilol (COREG) 6.25 MG tablet Take 6.25 mg by mouth 2 (two) times daily with a meal.     . citalopram (CELEXA) 40 MG tablet Take 40 mg by mouth daily at 6 (six) AM.     . clonazePAM (KLONOPIN) 0.5 MG tablet Take 0.5 mg by mouth 2 (two) times daily.     . clopidogrel (PLAVIX) 75 MG tablet Take 75 mg by mouth daily at 6 (six) AM.     . fenofibrate 160 MG tablet Take 160 mg by mouth daily.    . Magnesium Oxide 400 MG CAPS Take 1 capsule (400 mg total) by mouth in the morning, at noon, and at bedtime. 90 capsule 2  . nitroGLYCERIN (NITROSTAT) 0.4 MG SL tablet Place 0.4 mg under the tongue every 5 (five) minutes as needed for chest pain.    . potassium chloride SA (KLOR-CON) 20 MEQ tablet Take 20 mEq by mouth every evening.    . sacubitril-valsartan (ENTRESTO) 49-51 MG Take 1 tablet by mouth 2 (two) times daily.  60 tablet 0  . Vitamin D3 (VITAMIN D) 25 MCG tablet Take 1,000 Units by mouth daily.    Marland Kitchen acetaminophen (TYLENOL) 325 MG tablet Take 650 mg by mouth daily as needed for mild pain (headaches.).      No current facility-administered medications on file prior to visit.    PAST MEDICAL HISTORY: Past Medical History:  Diagnosis Date  . Arthritis   . Brittle bones   . CHF (congestive heart failure) (HCC)   . Coronary artery disease   . Depression   . Headache   . Hyperlipidemia   . Hypertension   . Seizures (HCC)   . TIA (transient ischemic attack)      PAST SURGICAL HISTORY: Past Surgical History:  Procedure Laterality Date  . ABDOMINAL HYSTERECTOMY    . APPENDECTOMY    . CARDIAC CATHETERIZATION    . CHOLECYSTECTOMY    . FRACTURE SURGERY     ankle right  . LEFT HEART CATH AND CORONARY ANGIOGRAPHY N/A 01/26/2020   Procedure: LEFT HEART CATH AND CORONARY ANGIOGRAPHY;  Surgeon: Elder Negus, MD;  Location: [facility] INVASIVE CV LAB;  Service: Cardiovascular;  Laterality: N/A;  . PATELLA FRACTURE SURGERY     1964    FAMILY HISTORY: The patient's family history includes Cancer in her sister.   SOCIAL HISTORY:  The patient  reports that she quit smoking about 55 years ago. Her smoking use included cigarettes. She has a 0.13 pack-year smoking history. She has never used smokeless tobacco. She reports that she does not drink alcohol and does not use drugs.  Review of Systems  Constitutional: Positive for malaise/fatigue. Negative for chills and fever.  HENT: Negative for hoarse voice and nosebleeds.   Eyes: Negative for discharge, double vision and pain.  Cardiovascular: Positive for dyspnea on exertion and orthopnea. Negative for chest pain, claudication, leg swelling, near-syncope, palpitations, paroxysmal nocturnal dyspnea and syncope.  Respiratory: Positive for shortness of breath. Negative for hemoptysis.   Musculoskeletal: Positive for joint pain. Negative for muscle cramps and myalgias.  Gastrointestinal: Negative for abdominal pain, constipation, diarrhea, hematemesis, hematochezia, melena, nausea and vomiting.  Neurological: Negative for dizziness and light-headedness.    PHYSICAL EXAM: Vitals with BMI 03/10/2020 02/22/2020 01/26/2020  Height 5\' 2"  5\' 2"  -  Weight 124 lbs 10 oz 125 lbs -  BMI 22.78 22.86 -  Systolic 125 143  Diastolic 57 59 58  Pulse 73 65 70    CONSTITUTIONAL: Elderly appearing female, ambulating with a cane, hemodynamically stable, no acute distress.   SKIN: Skin is warm and dry. No rash noted.  No cyanosis. No pallor. No jaundice HEAD: Normocephalic and atraumatic.  EYES: No scleral icterus MOUTH/THROAT: Moist oral membranes.  NECK: No JVD present. No thyromegaly noted. No carotid bruits  LYMPHATIC: No visible cervical adenopathy.  CHEST Normal respiratory effort. No intercostal retractions  LUNGS: Clear to auscultation bilaterally.  No stridor. No wheezes. No rales.  CARDIOVASCULAR: Regular rate rhythm, positive S1-S2, no murmurs rubs or gallops appreciated ABDOMINAL: No apparent ascites.  EXTREMITIES: Trace bilateral pitting edema.  Warm to touch. HEMATOLOGIC: No significant bruising NEUROLOGIC: Oriented to person, place, and time. Nonfocal. Normal muscle tone.  PSYCHIATRIC: Normal mood and affect. Normal behavior. Cooperative  CARDIAC DATABASE: EKG: 01/11/2020: Normal sinus rhythm, 85 bpm, left bundle branch block, poor R wave progression, without underlying injury pattern.   Echocardiogram: 12/27/2017: LVEF 50%, LV cavity size normal, abnormal septal motion due to LBBB, grade 1 diastolic impairment, mild MR, mild TR, no  pulmonary hypertension.  01/14/2020: LVEF 35%, moderate global and severe anterior hypokinesis, mild LVH, grade 1 diastolic impairment, normal LAP, mild left atrial dilatation, mild to moderate AR, moderate MR, moderate TR, moderate pulmonary hypertension, RVSP 50 mmHg.  Stress Testing:  Lexiscan Tetrofosmin Stress Test  01/18/2020: Non-diagnostic ECG stress. Myocardial perfusion is abnormal. There is a moderate degree large extent defect suggestive of scar with superimposed reversible defect in the inferior, septal and apical regions.  Gated SPECT imaging of the left ventricle was abnormal,  demonstrating global hypokinesis. Severely enlarged left ventricle. Overall LV systolic function is abnormal without regional wall motion abnormalities. Global hypokinesis of the left ventricle. Stress LV EF: 23%.  High risk study.  Compared to 12/16/2017 report, LVEF  34%, and no definite ischemia noted. Clinical correlation recommended.   Left heart catheterization by Dr. Evlyn Clines Patwardhan 01/26/2020:  LM: Normal  LAD: Minimal luminal irregularities  LCx: High OM1. Minimal luminal irregularities  RCA: Minimal luminal irregularities  The left ventricular ejection fraction is 35-45% by visual estimate.  Normal LVEDP   Event Monitor  01/03/2018-02/01/2018:  Baseline heart rate normal sinus rhythm. 6 patient triggered events occurred without any reported symptoms correlating with normal sinus rhythm. When autodetect at event occurred correlating with normal sinus rhythm with artifact. Lowest bradycardic heart rate was 49 bpm on 6/12 at 2:44 PM. Fastest tachycardia event was 107 bpm on 6/95 10:39 AM. No A. fib or SVT was noted  LABORATORY DATA: CBC Latest Ref Rng & Units 01/29/2020 01/05/2020 05/21/2012  WBC 3.4 - 10.8 x10E3/uL 5.8 5.4 7.4  Hemoglobin 11.1 - 15.9 g/dL 80.3 11.3(L) 11.4(L)  Hematocrit 34.0 - 46.6 % 37.4 34.7(L) 34.2(L)  Platelets 150 - 450 x10E3/uL 216 208 221    CMP Latest Ref Rng & Units 03/01/2020 02/19/2020 02/12/2020  Glucose 65 - 99 mg/dL 80 212(Y) 87  BUN 8 - 27 mg/dL 20 20 25   Creatinine 0.57 - 1.00 mg/dL 4.82(N) 0.03(B) 0.48(G)  Sodium 134 - 144 mmol/L 140 140 140  Potassium 3.5 - 5.2 mmol/L 4.5 3.8 4.7  Chloride 96 - 106 mmol/L 106 105 103  CO2 20 - 29 mmol/L 23 24 23   Calcium 8.7 - 10.3 mg/dL 9.2 9.5 9.8  Total Protein 6.0 - 8.3 g/dL - - -  Total Bilirubin 0.3 - 1.2 mg/dL - - -  Alkaline Phos 39 - 117 U/L - - -  AST 0 - 37 U/L - - -  ALT 0 - 35 U/L - - -    Lipid Panel     Component Value Date/Time   CHOL 105 05/20/2012 0630   TRIG 73 05/20/2012 0630   HDL 50 05/20/2012 0630   CHOLHDL 2.1 05/20/2012 0630   VLDL 15 05/20/2012 0630   LDLCALC 40 05/20/2012 0630    Lab Results  Component Value Date   HGBA1C 5.9 (H) 05/20/2012   No components found for: NTPROBNP Lab Results  Component Value Date   TSH 1.648 05/20/2012    TSH 0.698 03/07/2010    Cardiac Panel (last 3 results) No results for input(s): CKTOTAL, CKMB, TROPONINIHS, RELINDX in the last 72 hours.  IMPRESSION:    ICD-10-CM   1. Acute on chronic HFrEF (heart failure with reduced ejection fraction) (HCC)  I50.23 Basic metabolic panel    Pro b natriuretic peptide (BNP)    Magnesium  2. Nonischemic cardiomyopathy (HCC)  I42.8   3. Benign hypertension  I10   4. Mixed hyperlipidemia  E78.2   5. LBBB (left bundle  branch block)  I44.7   6. Former smoker  Z87.891   7. History of TIA (transient ischemic attack)  Z86.73      RECOMMENDATIONS: Anna Salazar is a 79 y.o. 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  Acute on chronic heart failure with reduced EF, stage B, NYHA class II/III:  Medications reconciled.  Continue Entresto 49/51 mg p.o. twice daily.   Will check labs today and if Cr is stable will start Aldactone and hold Kdur.   Continue current meds   Patient has lost weight since last visit and NTprobnp has improved as well.   Clinically improving.  Office will call her or her son at the following number with lab results [phone] and medication changes if needed. Recommend daily weight check, strict I/O's Fluid restriction to <2L per day, Na restriction < 2g per day  Nonischemic cardiomyopathy: See above  History of TIA: Currently on dual antiplatelet therapy.  Currently on statin therapy.  Benign essential hypertension:  Continue her home medications.  Currently managed by per primary team.  Left bundle branch block: Chronic and stable.   FINAL MEDICATION LIST END OF ENCOUNTER: No orders of the defined types were placed in this encounter.  There  are no discontinued medications.   Current Outpatient Medications:  .  aspirin EC 81 MG tablet, Take 81 mg by mouth every evening. , Disp: , Rfl:  .  atorvastatin (LIPITOR) 40 MG tablet, Take 40 mg by mouth at bedtime., Disp: , Rfl:  .  carvedilol (COREG) 6.25 MG tablet, Take 6.25 mg by mouth 2 (two) times daily with a meal. , Disp: , Rfl:  .  citalopram (CELEXA) 40 MG tablet, Take 40 mg by mouth daily at 6 (six) AM. , Disp: , Rfl:  .  clonazePAM (KLONOPIN) 0.5 MG tablet, Take 0.5 mg by mouth 2 (two) times daily. , Disp: , Rfl:  .  clopidogrel (PLAVIX) 75 MG tablet, Take 75 mg by mouth daily at 6 (six) AM. , Disp: , Rfl:  .  fenofibrate 160 MG tablet, Take 160 mg by mouth daily., Disp: , Rfl:  .  Magnesium Oxide 400 MG CAPS, Take 1 capsule (400 mg total) by mouth in the morning, at noon, and at bedtime., Disp: 90 capsule, Rfl: 2 .  nitroGLYCERIN (NITROSTAT) 0.4 MG SL tablet, Place 0.4 mg under the tongue every 5 (five) minutes as needed for chest pain., Disp: , Rfl:  .  potassium chloride SA (KLOR-CON) 20 MEQ tablet, Take 20 mEq by mouth every evening., Disp: , Rfl:  .  sacubitril-valsartan (ENTRESTO) 49-51 MG, Take 1 tablet by mouth 2 (two) times daily., Disp: 60 tablet, Rfl: 0 .  Vitamin D3 (VITAMIN D) 25 MCG tablet, Take 1,000 Units by mouth daily., Disp: , Rfl:  .  acetaminophen (TYLENOL) 325 MG tablet, Take 650 mg by mouth daily as needed for mild pain (headaches.). , Disp: , Rfl:   Orders Placed This Encounter  Procedures  . Basic metabolic panel  . Pro b natriuretic peptide (BNP)  . Magnesium   --Continue cardiac medications as reconciled in final medication list. --Return in about 4 weeks (around 04/07/2020) for heart failure management.. Or sooner if needed. --Continue follow-up with your primary care physician regarding the management of your other chronic comorbid conditions.  Patient's questions  and concerns were addressed to her satisfaction. She voices understanding of the  instructions provided during this encounter.   This note was created using a voice recognition software as a result there may be grammatical errors inadvertently enclosed that do not reflect the nature of this encounter. Every attempt is made to correct such errors.  Tessa Lerner, [place], [facility]  Pager: [phone] Office: [phone]

## 2020-03-11 ENCOUNTER — Other Ambulatory Visit: Payer: Self-pay

## 2020-03-11 ENCOUNTER — Other Ambulatory Visit: Payer: Self-pay | Admitting: Cardiology

## 2020-03-11 DIAGNOSIS — I5023 Acute on chronic systolic (congestive) heart failure: Secondary | ICD-10-CM

## 2020-03-11 DIAGNOSIS — I428 Other cardiomyopathies: Secondary | ICD-10-CM

## 2020-03-11 LAB — PRO B NATRIURETIC PEPTIDE: NT-Pro BNP: 672 pg/mL (ref 0–738)

## 2020-03-11 LAB — BASIC METABOLIC PANEL
BUN/Creatinine Ratio: 14 (ref 12–28)
BUN: 16 mg/dL (ref 8–27)
CO2: 23 mmol/L (ref 20–29)
Calcium: 9.7 mg/dL (ref 8.7–10.3)
Chloride: 106 mmol/L (ref 96–106)
Creatinine, Ser: 1.16 mg/dL — ABNORMAL HIGH (ref 0.57–1.00)
GFR calc Af Amer: 51 mL/min/{1.73_m2} — ABNORMAL LOW (ref >59)
GFR calc non Af Amer: 44 mL/min/{1.73_m2} — ABNORMAL LOW (ref >59)
Glucose: 77 mg/dL (ref 65–99)
Potassium: 4.3 mmol/L (ref 3.5–5.2)
Sodium: 140 mmol/L (ref 134–144)

## 2020-03-11 LAB — MAGNESIUM: Magnesium: 1.6 mg/dL (ref 1.6–2.3)

## 2020-03-11 MED ORDER — SPIRONOLACTONE 25 MG PO TABS: 12.5000 mg | ORAL_TABLET | Freq: Every morning | ORAL | 0 refills | Status: DC

## 2020-03-11 NOTE — Progress Notes (Signed)
Called and spoke with patient regarding her lab results. Patient showed understanding to HOLD potassium supplements and have repeat blood work in 1 week. She understands to start Spironolactone  12.5mg  p.o. every morning.

## 2020-03-17 ENCOUNTER — Other Ambulatory Visit: Payer: Self-pay | Admitting: Cardiology

## 2020-03-17 DIAGNOSIS — I5023 Acute on chronic systolic (congestive) heart failure: Secondary | ICD-10-CM

## 2020-03-17 DIAGNOSIS — I428 Other cardiomyopathies: Secondary | ICD-10-CM

## 2020-03-19 LAB — BASIC METABOLIC PANEL
BUN/Creatinine Ratio: 19 (ref 12–28)
BUN: 23 mg/dL (ref 8–27)
CO2: 21 mmol/L (ref 20–29)
Calcium: 9.1 mg/dL (ref 8.7–10.3)
Chloride: 110 mmol/L — ABNORMAL HIGH (ref 96–106)
Creatinine, Ser: 1.22 mg/dL — ABNORMAL HIGH (ref 0.57–1.00)
GFR calc Af Amer: 48 mL/min/{1.73_m2} — ABNORMAL LOW (ref >59)
GFR calc non Af Amer: 42 mL/min/{1.73_m2} — ABNORMAL LOW (ref >59)
Glucose: 73 mg/dL (ref 65–99)
Potassium: 4.4 mmol/L (ref 3.5–5.2)
Sodium: 142 mmol/L (ref 134–144)

## 2020-03-19 LAB — MAGNESIUM: Magnesium: 1.6 mg/dL (ref 1.6–2.3)

## 2020-03-19 LAB — PRO B NATRIURETIC PEPTIDE: NT-Pro BNP: 646 pg/mL (ref 0–738)

## 2020-03-21 ENCOUNTER — Other Ambulatory Visit: Payer: Self-pay | Admitting: Cardiology

## 2020-03-21 DIAGNOSIS — I5023 Acute on chronic systolic (congestive) heart failure: Secondary | ICD-10-CM

## 2020-03-22 ENCOUNTER — Telehealth: Payer: Self-pay

## 2020-03-22 NOTE — Telephone Encounter (Signed)
Patient is aware to continue Spiroloactone, HOLD Potassium, increase Magnesium to 4xday, and increase water intake to 1-2 glasses a day.

## 2020-03-22 NOTE — Telephone Encounter (Signed)
-----   Message from [place], [place] sent at 03/21/2020 11:07 PM EDT ----- Continue to take spironolactone.Hold potassium supplements.Increase magnesium supplements to 4 times a day dosing.May increase fluid intake by 1 to 2 glasses of water per day.Blood work on or after April 04, 2020 this would be prior to the next office visit.

## 2020-03-29 ENCOUNTER — Other Ambulatory Visit: Payer: Self-pay | Admitting: Cardiology

## 2020-03-29 DIAGNOSIS — I5023 Acute on chronic systolic (congestive) heart failure: Secondary | ICD-10-CM

## 2020-03-29 DIAGNOSIS — I428 Other cardiomyopathies: Secondary | ICD-10-CM

## 2020-04-06 LAB — BASIC METABOLIC PANEL
BUN/Creatinine Ratio: 15 (ref 12–28)
BUN: 16 mg/dL (ref 8–27)
CO2: 23 mmol/L (ref 20–29)
Calcium: 9.2 mg/dL (ref 8.7–10.3)
Chloride: 107 mmol/L — ABNORMAL HIGH (ref 96–106)
Creatinine, Ser: 1.07 mg/dL — ABNORMAL HIGH (ref 0.57–1.00)
GFR calc Af Amer: 56 mL/min/{1.73_m2} — ABNORMAL LOW (ref >59)
GFR calc non Af Amer: 49 mL/min/{1.73_m2} — ABNORMAL LOW (ref >59)
Glucose: 104 mg/dL — ABNORMAL HIGH (ref 65–99)
Potassium: 4 mmol/L (ref 3.5–5.2)
Sodium: 142 mmol/L (ref 134–144)

## 2020-04-06 LAB — MAGNESIUM: Magnesium: 1.6 mg/dL (ref 1.6–2.3)

## 2020-04-06 LAB — PRO B NATRIURETIC PEPTIDE: NT-Pro BNP: 490 pg/mL (ref 0–738)

## 2020-04-07 ENCOUNTER — Ambulatory Visit: Payer: Medicare HMO | Admitting: Cardiology

## 2020-04-07 ENCOUNTER — Encounter: Payer: Self-pay | Admitting: Cardiology

## 2020-04-07 ENCOUNTER — Other Ambulatory Visit: Payer: Self-pay

## 2020-04-07 VITALS — BP 126/49 | HR 76 | Resp 16 | Ht 62.0 in | Wt 126.0 lb

## 2020-04-07 DIAGNOSIS — I447 Left bundle-branch block, unspecified: Secondary | ICD-10-CM

## 2020-04-07 DIAGNOSIS — I428 Other cardiomyopathies: Secondary | ICD-10-CM

## 2020-04-07 DIAGNOSIS — Z8673 Personal history of transient ischemic attack (TIA), and cerebral infarction without residual deficits: Secondary | ICD-10-CM

## 2020-04-07 DIAGNOSIS — Z87891 Personal history of nicotine dependence: Secondary | ICD-10-CM

## 2020-04-07 DIAGNOSIS — I5022 Chronic systolic (congestive) heart failure: Secondary | ICD-10-CM

## 2020-04-07 DIAGNOSIS — E782 Mixed hyperlipidemia: Secondary | ICD-10-CM

## 2020-04-07 DIAGNOSIS — I10 Essential (primary) hypertension: Secondary | ICD-10-CM

## 2020-04-07 NOTE — Progress Notes (Signed)
Anna Salazar Date of Birth: 09/21/1940 MRN: [id number] Primary Care Provider:Moreira, Channing Mutters, MD Former Cardiology Providers: Dr. Yates Decamp Primary Cardiologist: Tessa Lerner, DO, [facility] (established care 01/11/2020)  Date: 04/07/20 Last Office Visit: 03/10/2020  Chief Complaint  Patient presents with  . HFrEF  . Follow-up    4 week    HPI  Anna Salazar is a 79 y.o.  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  Patient is accompanied by her husband at today's office visit.  Patient was originally referred to the office in June 2021 for preoperative or stratification.  However because patient was not able to do functional activity up to 4 METS and was feeling more tired and fatigued she was recommended to undergo an echo and stress test.  The echocardiogram noted LVEF of 35% compared to 50% in 2019 and nuclear stress test showed reversible ischemia.  At the last office visit patient was recommended to undergo left heart catheterization.  She did undergo left heart catheterization by Dr. Truett Mainland on January 26, 2020 and was found to have nonobstructive CAD with LVEF per LV gram of 35-45%.    Patient was started on heart failure medications in July 2021.  Since last office visit patient's tolerated the Entresto well without any side effects or intolerances.  She is also started on spironolactone 12.5 mg p.o. daily since last office visit and she is tolerating medication.  1 week post lab work was stable with normal electrolytes and stable kidney function.  Patient's NT proBNP has trended down significantly since initiation of guideline  directed medical therapy.  Patient states that she still feels tired and fatigued and shortness of breath with effort related activities is still present but improved.  She does not have any lower extremity swelling.  No use of sublingual nitroglycerin since last office visit.    ALLERGIES: Allergies  Allergen Reactions  . Morphine And Related Other (See Comments)    AMS  . Sulfa Antibiotics Hives  . Tegretol [Carbamazepine] Hives   MEDICATION LIST PRIOR TO VISIT: Current Outpatient Medications on File Prior to Visit  Medication Sig Dispense Refill  . acetaminophen (TYLENOL) 325 MG tablet Take 650 mg by mouth daily as needed for mild pain (headaches.).     Marland Kitchen aspirin EC 81 MG tablet Take 81 mg by mouth every evening.     Marland Kitchen atorvastatin (LIPITOR) 40 MG tablet Take 40 mg by mouth at bedtime.    . carvedilol (COREG) 6.25 MG tablet Take 6.25 mg by mouth 2 (two) times daily with a meal.     . citalopram (CELEXA) 40 MG tablet Take 40 mg by mouth daily at 6 (six) AM.     . clonazePAM (KLONOPIN) 0.5 MG tablet Take 0.5 mg by mouth 2 (two) times daily.     . clopidogrel (PLAVIX) 75 MG tablet Take 75 mg by mouth daily at 6 (six) AM.     . ENTRESTO 49-51 MG TAKE 1 TABLET BY MOUTH TWICE A DAY 60 tablet 6  . fenofibrate 160 MG tablet Take 160 mg by mouth daily.    . Magnesium Oxide 400 MG CAPS Take 1 capsule (400 mg total) by mouth in the morning, at noon, and at  bedtime. (Patient taking differently: Take 400 mg by mouth in the morning, at noon, in the evening, and at bedtime. ) 90 capsule 2  . nitroGLYCERIN (NITROSTAT) 0.4 MG SL tablet Place 0.4 mg under the tongue every 5 (five) minutes as needed for chest pain.    Marland Kitchen spironolactone (ALDACTONE) 25 MG tablet Take 0.5 tablets (12.5 mg total) by mouth in the morning for 90 doses. 45 tablet 0  . Vitamin D3 (VITAMIN D) 25 MCG tablet Take 1,000 Units by mouth daily.     No current facility-administered medications on file prior to visit.    PAST MEDICAL  HISTORY: Past Medical History:  Diagnosis Date  . Arthritis   . Brittle bones   . CHF (congestive heart failure) (HCC)   . Coronary artery disease   . Depression   . Headache   . Hyperlipidemia   . Hypertension   . Seizures (HCC)   . TIA (transient ischemic attack)     PAST SURGICAL HISTORY: Past Surgical History:  Procedure Laterality Date  . ABDOMINAL HYSTERECTOMY    . APPENDECTOMY    . CARDIAC CATHETERIZATION    . CHOLECYSTECTOMY    . FRACTURE SURGERY     ankle right  . LEFT HEART CATH AND CORONARY ANGIOGRAPHY N/A 01/26/2020   Procedure: LEFT HEART CATH AND CORONARY ANGIOGRAPHY;  Surgeon: Elder Negus, MD;  Location: [facility] INVASIVE CV LAB;  Service: Cardiovascular;  Laterality: N/A;  . PATELLA FRACTURE SURGERY     1964    FAMILY HISTORY: The patient's family history includes Cancer in her sister.   SOCIAL HISTORY:  The patient  reports that she quit smoking about 55 years ago. Her smoking use included cigarettes. She has a 0.13 pack-year smoking history. She has never used smokeless tobacco. She reports that she does not drink alcohol and does not use drugs.  Review of Systems  Constitutional: Positive for malaise/fatigue. Negative for chills and fever.  HENT: Negative for hoarse voice and nosebleeds.   Eyes: Negative for discharge, double vision and pain.  Cardiovascular: Positive for dyspnea on exertion. Negative for chest pain, claudication, leg swelling, near-syncope, orthopnea, palpitations, paroxysmal nocturnal dyspnea and syncope.  Respiratory: Positive for shortness of breath. Negative for hemoptysis.   Musculoskeletal: Positive for joint pain. Negative for muscle cramps and myalgias.  Gastrointestinal: Negative for abdominal pain, constipation, diarrhea, hematemesis, hematochezia, melena, nausea and vomiting.  Neurological: Negative for dizziness and light-headedness.    PHYSICAL EXAM: Vitals with BMI 04/07/2020 03/10/2020 02/22/2020  Height 5\' 2"  5\' 2"  5'  2"  Weight 126 lbs 124 lbs 10 oz 125 lbs  BMI 23.04 22.78 22.86  Systolic 126 125 824  Diastolic 49 57 59  Pulse 76 73 65    CONSTITUTIONAL: Elderly appearing female, ambulating with a cane, hemodynamically stable, no acute distress.   SKIN: Skin is warm and dry. No rash noted. No cyanosis. No pallor. No jaundice HEAD: Normocephalic and atraumatic.  EYES: No scleral icterus MOUTH/THROAT: Moist oral membranes.  NECK: No JVD present. No thyromegaly noted. No carotid bruits  LYMPHATIC: No visible cervical adenopathy.  CHEST Normal respiratory effort. No intercostal retractions  LUNGS: Clear to auscultation bilaterally.  No stridor. No wheezes. No rales.  CARDIOVASCULAR: Regular rate rhythm, positive S1-S2, no murmurs rubs or gallops appreciated ABDOMINAL: No apparent ascites.  EXTREMITIES: Trace bilateral pitting edema.  Warm to touch. HEMATOLOGIC: No significant bruising NEUROLOGIC: Oriented to person, place, and time. Nonfocal. Normal muscle tone.  PSYCHIATRIC: Normal mood and affect. Normal  behavior. Cooperative  CARDIAC DATABASE: EKG: 01/11/2020: Normal sinus rhythm, 85 bpm, left bundle branch block, poor R wave progression, without underlying injury pattern.   Echocardiogram: 12/27/2017: LVEF 50%, LV cavity size normal, abnormal septal motion due to LBBB, grade 1 diastolic impairment, mild MR, mild TR, no pulmonary hypertension.  01/14/2020: LVEF 35%, moderate global and severe anterior hypokinesis, mild LVH, grade 1 diastolic impairment, normal LAP, mild left atrial dilatation, mild to moderate AR, moderate MR, moderate TR, moderate pulmonary hypertension, RVSP 50 mmHg.  Stress Testing:  Lexiscan Tetrofosmin Stress Test  01/18/2020: Non-diagnostic ECG stress. Myocardial perfusion is abnormal. There is a moderate degree large extent defect suggestive of scar with superimposed reversible defect in the inferior, septal and apical regions.  Gated SPECT imaging of the left ventricle  was abnormal,  demonstrating global hypokinesis. Severely enlarged left ventricle. Overall LV systolic function is abnormal without regional wall motion abnormalities. Global hypokinesis of the left ventricle. Stress LV EF: 23%.  High risk study.  Compared to 12/16/2017 report, LVEF 34%, and no definite ischemia noted. Clinical correlation recommended.   Left heart catheterization by Dr. Evlyn Clines Patwardhan 01/26/2020:  LM: Normal  LAD: Minimal luminal irregularities  LCx: High OM1. Minimal luminal irregularities  RCA: Minimal luminal irregularities  The left ventricular ejection fraction is 35-45% by visual estimate.  Normal LVEDP   Event Monitor  01/03/2018-02/01/2018:  Baseline heart rate normal sinus rhythm. 6 patient triggered events occurred without any reported symptoms correlating with normal sinus rhythm. When autodetect at event occurred correlating with normal sinus rhythm with artifact. Lowest bradycardic heart rate was 49 bpm on 6/12 at 2:44 PM. Fastest tachycardia event was 107 bpm on 6/95 10:39 AM. No A. fib or SVT was noted  LABORATORY DATA: CBC Latest Ref Rng & Units 01/29/2020 01/05/2020 05/21/2012  WBC 3.4 - 10.8 x10E3/uL 5.8 5.4 7.4  Hemoglobin 11.1 - 15.9 g/dL 30.1 11.3(L) 11.4(L)  Hematocrit 34.0 - 46.6 % 37.4 34.7(L) 34.2(L)  Platelets 150 - 450 x10E3/uL 216 208 221    CMP Latest Ref Rng & Units 04/05/2020 03/18/2020 03/10/2020  Glucose 65 - 99 mg/dL 601(U) 73 77  BUN 8 - 27 mg/dL 16 23 16   Creatinine 0.57 - 1.00 mg/dL ) 9.32(T) 5.57(D)  Sodium 134 - 144 mmol/L 142 142 140  Potassium 3.5 - 5.2 mmol/L 4.0 4.4 4.3  Chloride 96 - 106 mmol/L 107(H) 110(H) 106  CO2 20 - 29 mmol/L 23 21 23   Calcium 8.7 - 10.3 mg/dL 9.2 9.1 9.7  Total Protein 6.0 - 8.3 g/dL - - -  Total Bilirubin 0.3 - 1.2 mg/dL - - -  Alkaline Phos 39 - 117 U/L - - -  AST 0 - 37 U/L - - -  ALT 0 - 35 U/L - - -    Lipid Panel     Component Value Date/Time   CHOL 105 05/20/2012 0630   TRIG 73  05/20/2012 0630   HDL 50 05/20/2012 0630   CHOLHDL 2.1 05/20/2012 0630   VLDL 15 05/20/2012 0630   LDLCALC 40 05/20/2012 0630    Lab Results  Component Value Date   HGBA1C 5.9 (H) 05/20/2012   No components found for: NTPROBNP Lab Results  Component Value Date   TSH 1.648 05/20/2012   TSH 0.698 03/07/2010    Cardiac Panel (last 3 results) No results for input(s): CKTOTAL, CKMB, TROPONINIHS, RELINDX in the last 72 hours.  IMPRESSION:    ICD-10-CM   1. Chronic HFrEF (heart failure with reduced  ejection fraction) (HCC)  I50.22 PCV ECHOCARDIOGRAM COMPLETE  2. Benign hypertension  I10   3. Nonischemic cardiomyopathy (HCC)  I42.8 PCV ECHOCARDIOGRAM COMPLETE  4. Mixed hyperlipidemia  E78.2   5. LBBB (left bundle branch block)  I44.7   6. Former smoker  Z87.891   7. History of TIA (transient ischemic attack)  Z86.73   8. Hypomagnesemia  E83.42      RECOMMENDATIONS: Anna Salazar is a 79 y.o. 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  Acute on chronic heart failure with reduced EF, stage B, NYHA class II/III:  Medications reconciled.  Continue Entresto 49/51 mg p.o. twice daily.   Continue Aldactone 12.5 mg p.o. daily.  Potassium remained stable after the initiation of Aldactone and holding potassium supplements.  Patient's NT proBNP has improved significantly since initiation of heart failure therapy and since last office visit the weight is increased by 2 pounds.  Patient is currently being followed by principal care management for heart failure management as well.  Clinically improving.  We discussed uptitrating either Entresto or Aldactone at today's office visit.  However, patient states that she  wants to continue the current medical therapy without any additional changes as she is currently already feeling tired and fatigued.  Patient will monitor her symptoms for now. Would recommend an echocardiogram in 3 months to reevaluate LVEF after the initiation of catheter medical therapy. Recommend daily weight check, strict I/O's Fluid restriction to <2L per day, Na restriction < 2g per day  Nonischemic cardiomyopathy: See above  History of TIA: Currently on dual antiplatelet therapy.  Currently on statin therapy.  Benign essential hypertension:  Continue her home medications.  Currently managed by per primary team.  Left bundle branch block: Chronic and stable.   FINAL MEDICATION LIST END OF ENCOUNTER: No orders of the defined types were placed in this encounter.  There are no discontinued medications.   Current Outpatient Medications:  .  acetaminophen (TYLENOL) 325 MG tablet, Take 650 mg by mouth daily as needed for mild pain (headaches.). , Disp: , Rfl:  .  aspirin EC 81 MG tablet, Take 81 mg by mouth every evening. , Disp: , Rfl:  .  atorvastatin (LIPITOR) 40 MG tablet, Take 40 mg by mouth at bedtime., Disp: , Rfl:  .  carvedilol (COREG) 6.25 MG tablet, Take 6.25 mg by mouth 2 (two) times daily with a meal. , Disp: , Rfl:  .  citalopram (CELEXA) 40 MG tablet, Take 40 mg by mouth daily at 6 (six) AM. , Disp: , Rfl:  .  clonazePAM (KLONOPIN) 0.5 MG tablet, Take 0.5 mg by mouth 2 (two) times daily. , Disp: , Rfl:  .  clopidogrel (PLAVIX) 75 MG tablet, Take 75 mg by mouth daily at 6 (six) AM. , Disp: , Rfl:  .  ENTRESTO 49-51 MG, TAKE 1 TABLET BY MOUTH TWICE A DAY, Disp: 60 tablet, Rfl: 6 .  fenofibrate 160 MG tablet, Take 160 mg by mouth daily., Disp: , Rfl:  .  Magnesium Oxide 400 MG CAPS, Take 1 capsule (400 mg total) by mouth in the morning, at noon, and at bedtime. (Patient taking differently: Take 400 mg by mouth in the morning, at noon, in the evening, and at bedtime. ),  Disp: 90 capsule, Rfl: 2 .  nitroGLYCERIN (NITROSTAT) 0.4  MG SL tablet, Place 0.4 mg under the tongue every 5 (five) minutes as needed for chest pain., Disp: , Rfl:  .  spironolactone (ALDACTONE) 25 MG tablet, Take 0.5 tablets (12.5 mg total) by mouth in the morning for 90 doses., Disp: 45 tablet, Rfl: 0 .  Vitamin D3 (VITAMIN D) 25 MCG tablet, Take 1,000 Units by mouth daily., Disp: , Rfl:   Orders Placed This Encounter  Procedures  . PCV ECHOCARDIOGRAM COMPLETE   --Continue cardiac medications as reconciled in final medication list. --Return in about 3 months (around 07/15/2020) for heart failure management., Review test results. Or sooner if needed. --Continue follow-up with your primary care physician regarding the management of your other chronic comorbid conditions.  Patient's questions and concerns were addressed to her satisfaction. She voices understanding of the instructions provided during this encounter.   This note was created using a voice recognition software as a result there may be grammatical errors inadvertently enclosed that do not reflect the nature of this encounter. Every attempt is made to correct such errors.  Tessa Lerner, [place], [facility]  Pager: [phone] Office: [phone]

## 2020-04-07 NOTE — Progress Notes (Signed)
No answer left a voicemail will try again later sch/rma

## 2020-04-08 NOTE — Progress Notes (Signed)
Left v/m for pt to cb. 

## 2020-04-12 NOTE — Progress Notes (Signed)
Patient will be here at her scheduled appt and will bring in all medications.

## 2020-04-13 ENCOUNTER — Other Ambulatory Visit: Payer: Self-pay | Admitting: Cardiology

## 2020-04-13 DIAGNOSIS — I10 Essential (primary) hypertension: Secondary | ICD-10-CM

## 2020-04-13 DIAGNOSIS — I429 Cardiomyopathy, unspecified: Secondary | ICD-10-CM

## 2020-04-13 NOTE — Telephone Encounter (Signed)
Pt currently on entresto 49/51 BID. Called and reviewed with pt. Verified with pt. Denies having losartan at home. Tolerating entresto and spironolactone without any ADRs. Called pharmacy to deactivate prior losartan Rx.

## 2020-04-13 NOTE — Telephone Encounter (Signed)
Agree no Losartan as she is on entresto and tolerating it well.

## 2020-06-02 ENCOUNTER — Other Ambulatory Visit: Payer: Self-pay | Admitting: Cardiology

## 2020-06-02 DIAGNOSIS — I428 Other cardiomyopathies: Secondary | ICD-10-CM

## 2020-06-02 DIAGNOSIS — I5023 Acute on chronic systolic (congestive) heart failure: Secondary | ICD-10-CM

## 2020-06-08 ENCOUNTER — Telehealth: Payer: Self-pay

## 2020-06-08 NOTE — Telephone Encounter (Signed)
Patient called she only has two days left of her spironolactone she doesn't have no refills and would like to know if she needs to continue rx please advise I can send a refill.

## 2020-06-08 NOTE — Telephone Encounter (Signed)
Please refill it. 

## 2020-06-09 ENCOUNTER — Other Ambulatory Visit: Payer: Self-pay

## 2020-06-09 DIAGNOSIS — I428 Other cardiomyopathies: Secondary | ICD-10-CM

## 2020-06-09 DIAGNOSIS — I5023 Acute on chronic systolic (congestive) heart failure: Secondary | ICD-10-CM

## 2020-06-09 MED ORDER — SPIRONOLACTONE 25 MG PO TABS
12.5000 mg | ORAL_TABLET | Freq: Every morning | ORAL | 0 refills | Status: AC
Start: 2020-06-09 — End: 2020-09-07

## 2020-06-09 NOTE — Telephone Encounter (Signed)
It's sent

## 2020-07-01 ENCOUNTER — Other Ambulatory Visit: Payer: Self-pay | Admitting: Internal Medicine

## 2020-07-01 ENCOUNTER — Ambulatory Visit
Admission: RE | Admit: 2020-07-01 | Discharge: 2020-07-01 | Disposition: A | Discharge status: Home / Self Care | Payer: Medicare HMO | Source: Ambulatory Visit | Attending: Internal Medicine | Admitting: Internal Medicine

## 2020-07-01 DIAGNOSIS — R519 Headache, unspecified: Secondary | ICD-10-CM

## 2020-07-01 DIAGNOSIS — R55 Syncope and collapse: Secondary | ICD-10-CM

## 2020-07-01 DIAGNOSIS — R42 Dizziness and giddiness: Secondary | ICD-10-CM

## 2020-07-01 MED ORDER — IOPAMIDOL (ISOVUE-300) INJECTION 61%
75.0000 mL | Freq: Once | INTRAVENOUS | Status: AC | PRN
  Administered 2020-07-01: 75 mL via INTRAVENOUS

## 2020-07-05 ENCOUNTER — Ambulatory Visit: Payer: Medicare HMO

## 2020-07-05 ENCOUNTER — Other Ambulatory Visit: Payer: Self-pay

## 2020-07-05 DIAGNOSIS — I5022 Chronic systolic (congestive) heart failure: Secondary | ICD-10-CM

## 2020-07-05 DIAGNOSIS — I428 Other cardiomyopathies: Secondary | ICD-10-CM

## 2020-07-08 NOTE — Progress Notes (Signed)
Spoke to pt

## 2020-07-13 ENCOUNTER — Ambulatory Visit: Payer: Medicare HMO | Admitting: Cardiology

## 2020-07-13 ENCOUNTER — Other Ambulatory Visit: Payer: Self-pay

## 2020-07-13 ENCOUNTER — Encounter: Payer: Self-pay | Admitting: Cardiology

## 2020-07-13 VITALS — BP 172/73 | HR 69 | Ht 62.0 in | Wt 130.0 lb

## 2020-07-13 DIAGNOSIS — I5041 Acute combined systolic (congestive) and diastolic (congestive) heart failure: Secondary | ICD-10-CM

## 2020-07-13 DIAGNOSIS — E782 Mixed hyperlipidemia: Secondary | ICD-10-CM

## 2020-07-13 DIAGNOSIS — Z87891 Personal history of nicotine dependence: Secondary | ICD-10-CM

## 2020-07-13 DIAGNOSIS — I10 Essential (primary) hypertension: Secondary | ICD-10-CM

## 2020-07-13 DIAGNOSIS — I428 Other cardiomyopathies: Secondary | ICD-10-CM

## 2020-07-13 DIAGNOSIS — Z8673 Personal history of transient ischemic attack (TIA), and cerebral infarction without residual deficits: Secondary | ICD-10-CM

## 2020-07-13 DIAGNOSIS — I447 Left bundle-branch block, unspecified: Secondary | ICD-10-CM

## 2020-07-13 MED ORDER — ENTRESTO 97-103 MG PO TABS: 1.0000 | ORAL_TABLET | Freq: Two times a day (BID) | ORAL | 0 refills | Status: DC

## 2020-07-13 NOTE — Progress Notes (Signed)
Anna Salazar Date of Birth: 15-Apr-1941 MRN: [id number] Primary Care Provider:Moreira, Carloyn Manner, MD Former Cardiology Providers: Dr. Adrian Prows Primary Cardiologist: Rex Kras, DO, [facility] (established care 01/11/2020)  Date: 07/13/20 Last Office Visit: 04/07/2020  Chief Complaint  Patient presents with   Chronic HFrEF (heart failure with reduced ejection fraction)   Follow-up    HPI  Anna Salazar is a 79 y.o.  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  Patient is accompanied by her son (Ed) at today's office visit.  Patient was originally referred to the office in June 2021 for preoperative risk stratification.  However, due to reduced functional activity she underwent echo and stress test. Echo noted moderately reduced LVEF and valvular heart disease and stress was noted reversible ischemia as well. She underwent LHC and was noted to have nonobstructive CAD with LVEF per LV gram of 35-45%.    Patient was started on guideline directed medical therapy and it has been uptitrated over the last several office visits.  At the last office visit patient was doing well clinically and did not want any additional up titration of medications.  She presents for 86-monthfollow-up today.  Over the last 3 months patient states that she has been increasing her fluid intake and consumes approximately 3 L of fluids (including coffee, tea, water, and other beverages).  In addition, she is feeling more tired and fatigued and shortness of breath with effort related activities.  Patient's morning blood pressures have been trending up and today her office  blood pressures are elevated because she has not taken her morning meds.  Her weight is also trending up at home compared to before.    Patient's son states that she has decided to change her CODE STATUS to DNR and would like to donate her body to science.  She has already informed her PCP regarding this.  No use of sublingual nitroglycerin tablets since last office visit.  ALLERGIES: Allergies  Allergen Reactions   Morphine And Related Other (See Comments)    AMS   Sulfa Antibiotics Hives   Tegretol [Carbamazepine] Hives   MEDICATION LIST PRIOR TO VISIT: Current Outpatient Medications on File Prior to Visit  Medication Sig Dispense Refill   acetaminophen (TYLENOL) 325 MG tablet Take 650 mg by mouth daily as needed for mild pain (headaches.).      aspirin EC 81 MG tablet Take 81 mg by mouth every evening.      atorvastatin (LIPITOR) 40 MG tablet Take 40 mg by mouth at bedtime.     carvedilol (COREG) 6.25 MG tablet Take 6.25 mg by mouth 2 (two) times daily with a meal.      clonazePAM (KLONOPIN) 0.5 MG tablet Take 0.5 mg by mouth 2 (two) times daily.      clopidogrel (PLAVIX) 75 MG tablet Take 75 mg by mouth daily at 6 (six) AM.      fenofibrate 160 MG tablet Take 160 mg by mouth daily.     magnesium oxide (MAG-OX) 400 MG tablet Take 400 mg by mouth 3 (three) times daily.     nitroGLYCERIN (NITROSTAT) 0.4 MG SL tablet Place 0.4 mg under the tongue every 5 (five) minutes as needed for chest pain.  spironolactone (ALDACTONE) 25 MG tablet Take 0.5 tablets (12.5 mg total) by mouth in the morning for 90 doses. 45 tablet 0   Vitamin D3 (VITAMIN D) 25 MCG tablet Take 1,000 Units by mouth daily.     No current facility-administered medications on file prior to visit.    PAST MEDICAL HISTORY: Past Medical History:  Diagnosis Date   Arthritis    Brittle bones    CHF (congestive heart failure) (HCC)    Coronary artery disease    Depression    Headache     Hyperlipidemia    Hypertension    Seizures (HCC)    TIA (transient ischemic attack)     PAST SURGICAL HISTORY: Past Surgical History:  Procedure Laterality Date   ABDOMINAL HYSTERECTOMY     APPENDECTOMY     CARDIAC CATHETERIZATION     CHOLECYSTECTOMY     FRACTURE SURGERY     ankle right   LEFT HEART CATH AND CORONARY ANGIOGRAPHY N/A 01/26/2020   Procedure: LEFT HEART CATH AND CORONARY ANGIOGRAPHY;  Surgeon: Nigel Mormon, MD;  Location: [facility];  Service: Cardiovascular;  Laterality: N/A;   PATELLA FRACTURE SURGERY     1964    FAMILY HISTORY: The patient's family history includes Cancer in her sister.   SOCIAL HISTORY:  The patient  reports that she quit smoking about 55 years ago. Her smoking use included cigarettes. She has a 0.13 pack-year smoking history. She has never used smokeless tobacco. She reports that she does not drink alcohol and does not use drugs.  Review of Systems  Constitutional: Positive for malaise/fatigue. Negative for chills and fever.  HENT: Negative for hoarse voice and nosebleeds.   Eyes: Negative for discharge, double vision and pain.  Cardiovascular: Positive for dyspnea on exertion. Negative for chest pain, claudication, leg swelling, near-syncope, orthopnea, palpitations, paroxysmal nocturnal dyspnea and syncope.  Respiratory: Positive for shortness of breath. Negative for hemoptysis.   Musculoskeletal: Positive for joint pain. Negative for muscle cramps and myalgias.  Gastrointestinal: Negative for abdominal pain, constipation, diarrhea, hematemesis, hematochezia, melena, nausea and vomiting.  Neurological: Negative for dizziness and light-headedness.    PHYSICAL EXAM: Vitals with BMI 07/13/2020 07/13/2020 04/07/2020  Height - 5' 2"  5' 2"   Weight - 130 lbs 126 lbs  BMI - 83.41 96.22  Systolic 297 989 211  Diastolic 73 70 49  Pulse 69 68 76    CONSTITUTIONAL: Elderly appearing female, ambulating with a cane,  hemodynamically stable, no acute distress.   SKIN: Skin is warm and dry. No rash noted. No cyanosis. No pallor. No jaundice HEAD: Normocephalic and atraumatic.  EYES: No scleral icterus MOUTH/THROAT: Moist oral membranes.  NECK: No JVD present. No thyromegaly noted. No carotid bruits  LYMPHATIC: No visible cervical adenopathy.  CHEST Normal respiratory effort. No intercostal retractions  LUNGS: Clear to auscultation bilaterally.  No stridor. No wheezes. No rales.  CARDIOVASCULAR: Regular rate rhythm, positive S1-S2, no murmurs rubs or gallops appreciated ABDOMINAL: No apparent ascites.  EXTREMITIES: Trace bilateral pitting edema.  Warm to touch. HEMATOLOGIC: No significant bruising NEUROLOGIC: Oriented to person, place, and time. Nonfocal. Normal muscle tone.  PSYCHIATRIC: Normal mood and affect. Normal behavior. Cooperative  CARDIAC DATABASE: EKG: 01/11/2020: Normal sinus rhythm, 85 bpm, left bundle branch block, poor R wave progression, without underlying injury pattern.   Echocardiogram: 12/27/2017: LVEF 50%, LV cavity size normal, abnormal septal motion due to LBBB, grade 1 diastolic impairment, mild MR, mild TR, no pulmonary hypertension.  01/14/2020: LVEF 35%,  moderate global and severe anterior hypokinesis, mild LVH, grade 1 diastolic impairment, normal LAP, mild left atrial dilatation, mild to moderate AR, moderate MR, moderate TR, moderate pulmonary hypertension, RVSP 50 mmHg.  07/05/2020: Left ventricle cavity is moderately dilated. Normal left ventricular wall thickness. Moderate global hypokinesis, LVEF 35-40%. Doppler evidence of grade I (impaired) diastolic dysfunction, normal LAP.  Left atrial cavity is mildly dilated. Structurally normal trileaflet aortic valve. Moderate (Grade II) aortic regurgitation. Moderate (Grade II) mitral regurgitation. Mild tricuspid regurgitation.  No evidence of pulmonary hypertension. Unlike previous study on 01/14/2020, pulmonary hypertension  (estimated PASP 50 mmHg) not seen on this study.  Stress Testing:  Lexiscan Tetrofosmin Stress Test  01/18/2020: Non-diagnostic ECG stress. Myocardial perfusion is abnormal. There is a moderate degree large extent defect suggestive of scar with superimposed reversible defect in the inferior, septal and apical regions.  Gated SPECT imaging of the left ventricle was abnormal,  demonstrating global hypokinesis. Severely enlarged left ventricle. Overall LV systolic function is abnormal without regional wall motion abnormalities. Global hypokinesis of the left ventricle. Stress LV EF: 23%.  High risk study.  Compared to 12/16/2017 report, LVEF 34%, and no definite ischemia noted. Clinical correlation recommended.   Left heart catheterization by Dr. Joya Gaskins Patwardhan 01/26/2020:  LM: Normal  LAD: Minimal luminal irregularities  LCx: High OM1. Minimal luminal irregularities  RCA: Minimal luminal irregularities  The left ventricular ejection fraction is 35-45% by visual estimate.  Normal LVEDP   Event Monitor  01/03/2018-02/01/2018:  Baseline heart rate normal sinus rhythm. 6 patient triggered events occurred without any reported symptoms correlating with normal sinus rhythm. When autodetect at event occurred correlating with normal sinus rhythm with artifact. Lowest bradycardic heart rate was 49 bpm on 6/12 at 2:44 PM. Fastest tachycardia event was 107 bpm on 6/95 10:39 AM. No A. fib or SVT was noted  LABORATORY DATA: CBC Latest Ref Rng & Units 01/29/2020 01/05/2020 05/21/2012  WBC 3.4 - 10.8 x10E3/uL 5.8 5.4 7.4  Hemoglobin 11.1 - 15.9 g/dL 12.6 11.3(L) 11.4(L)  Hematocrit 34.0 - 46.6 % 37.4 34.7(L) 34.2(L)  Platelets 150 - 450 x10E3/uL 216 208 221    CMP Latest Ref Rng & Units 04/05/2020 03/18/2020 03/10/2020  Glucose 65 - 99 mg/dL 104(H) 73 77  BUN 8 - 27 mg/dL 16 23 16   Creatinine 0.57 - 1.00 mg/dL 1.07(H) 1.22(H) 1.16(H)  Sodium 134 - 144 mmol/L 142 142 140  Potassium 3.5 - 5.2 mmol/L 4.0 4.4  4.3  Chloride 96 - 106 mmol/L 107(H) 110(H) 106  CO2 20 - 29 mmol/L 23 21 23   Calcium 8.7 - 10.3 mg/dL 9.2 9.1 9.7  Total Protein 6.0 - 8.3 g/dL - - -  Total Bilirubin 0.3 - 1.2 mg/dL - - -  Alkaline Phos 39 - 117 U/L - - -  AST 0 - 37 U/L - - -  ALT 0 - 35 U/L - - -    Lipid Panel     Component Value Date/Time   CHOL 105 05/20/2012 0630   TRIG 73 05/20/2012 0630   HDL 50 05/20/2012 0630   CHOLHDL 2.1 05/20/2012 0630   VLDL 15 05/20/2012 0630   LDLCALC 40 05/20/2012 0630    Lab Results  Component Value Date   HGBA1C 5.9 (H) 05/20/2012   No components found for: NTPROBNP Lab Results  Component Value Date   TSH 1.648 05/20/2012   TSH 0.698 03/07/2010    External Labs: Collected: 06/16/2020. Hemoglobin 11.5 g/dL.  Hematocrit 34.9%.   Creatinine  1.1 mg/dL. eGFR: 47 mL/min per 1.73 m Lipid profile: Total cholesterol 98, triglycerides 67, HDL 44,LDL 41 ProBNP: 485  External Labs: Collected: 07/01/2020 Creatinine 0.99 mg/dL. eGFR: 53 mL/min per 1.73 m Hemoglobin 11.5 g/dL.  IMPRESSION:    ICD-10-CM   1. Acute combined systolic and diastolic CHF, NYHA class 3 (HCC)  I50.41 sacubitril-valsartan (ENTRESTO) 97-103 MG    Basic metabolic panel    Pro b natriuretic peptide (BNP)    Magnesium  2. Nonischemic cardiomyopathy (HCC)  I42.8 sacubitril-valsartan (ENTRESTO) 97-103 MG  3. Benign hypertension  I10   4. Mixed hyperlipidemia  E78.2   5. LBBB (left bundle branch block)  I44.7   6. Former smoker  Z87.891   66. History of TIA (transient ischemic attack)  Z86.73   8. Hypomagnesemia  E83.42      RECOMMENDATIONS: JACILYN SANPEDRO is a 79 y.o. 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  Acute on  chronic combined systolic and diastolic heart failure, stage B, NYHA class III:  Medications reconciled.  Increased Entresto 97/103 mg p.o. twice daily.   Continue Aldactone 12.5 mg p.o. daily.    Patient is currently being followed by principal care management for heart failure management as well.  Most recent echocardiogram 07/05/2020 reviewed with patient and his son at today's office visit.  LVEF has relatively remained stable but slightly improving.  Independently reviewed labs performed on June 06, 2020 and July 01, 2020 summarized findings noted above.  We will repeat blood work in 1 week after up titration of Entresto.  I would like to see her back in 4 weeks to possibly start Farxiga. Recommend daily weight check, strict I/O's Fluid restriction to <2L per day, Na restriction < 2g per day  Nonischemic cardiomyopathy: See above  History of TIA: Currently on dual antiplatelet therapy.  Currently on statin therapy.  Benign essential hypertension:  Continue her home medications.  Currently managed by per primary team.  Left bundle branch block: Chronic and stable.   FINAL MEDICATION LIST END OF ENCOUNTER: Meds ordered this encounter  Medications   sacubitril-valsartan (ENTRESTO) 97-103 MG    Sig: Take 1 tablet by mouth 2 (two) times daily.    Dispense:  60 tablet    Refill:  0   Medications Discontinued During This Encounter  Medication Reason   celecoxib (CELEBREX) 200 MG capsule Completed Course   citalopram (CELEXA) 40 MG tablet Patient Preference   ENTRESTO 49-51 MG Dose change     Current Outpatient Medications:    acetaminophen (TYLENOL) 325 MG tablet, Take 650 mg by mouth daily as needed for mild pain (headaches.). , Disp: , Rfl:    aspirin EC 81 MG tablet, Take 81 mg by mouth every evening. , Disp: , Rfl:    atorvastatin (LIPITOR) 40 MG tablet, Take 40 mg by mouth at bedtime., Disp: , Rfl:    carvedilol (COREG) 6.25 MG tablet, Take 6.25 mg by  mouth 2 (two) times daily with a meal. , Disp: , Rfl:    clonazePAM (KLONOPIN) 0.5 MG tablet, Take 0.5 mg by mouth 2 (two) times daily. , Disp: , Rfl:    clopidogrel (PLAVIX) 75 MG tablet, Take 75 mg by mouth daily at 6 (six) AM. , Disp: , Rfl:    fenofibrate 160 MG tablet, Take 160 mg  by mouth daily., Disp: , Rfl:    magnesium oxide (MAG-OX) 400 MG tablet, Take 400 mg by mouth 3 (three) times daily., Disp: , Rfl:    nitroGLYCERIN (NITROSTAT) 0.4 MG SL tablet, Place 0.4 mg under the tongue every 5 (five) minutes as needed for chest pain., Disp: , Rfl:    spironolactone (ALDACTONE) 25 MG tablet, Take 0.5 tablets (12.5 mg total) by mouth in the morning for 90 doses., Disp: 45 tablet, Rfl: 0   Vitamin D3 (VITAMIN D) 25 MCG tablet, Take 1,000 Units by mouth daily., Disp: , Rfl:    sacubitril-valsartan (ENTRESTO) 97-103 MG, Take 1 tablet by mouth 2 (two) times daily., Disp: 60 tablet, Rfl: 0  Orders Placed This Encounter  Procedures   Basic metabolic panel   Pro b natriuretic peptide (BNP)   Magnesium   --Continue cardiac medications as reconciled in final medication list. --Return in about 4 weeks (around 08/10/2020) for heart failure management.. Or sooner if needed. --Continue follow-up with your primary care physician regarding the management of your other chronic comorbid conditions.  Patient's questions and concerns were addressed to her satisfaction. She voices understanding of the instructions provided during this encounter.   This note was created using a voice recognition software as a result there may be grammatical errors inadvertently enclosed that do not reflect the nature of this encounter. Every attempt is made to correct such errors.  Rex Kras, [place], [facility]  Pager: [phone] Office: [phone]

## 2020-07-26 LAB — BASIC METABOLIC PANEL
BUN/Creatinine Ratio: 15 (ref 12–28)
BUN: 16 mg/dL (ref 8–27)
CO2: 24 mmol/L (ref 20–29)
Calcium: 9.2 mg/dL (ref 8.7–10.3)
Chloride: 109 mmol/L — ABNORMAL HIGH (ref 96–106)
Creatinine, Ser: 1.08 mg/dL — ABNORMAL HIGH (ref 0.57–1.00)
GFR calc Af Amer: 56 mL/min/{1.73_m2} — ABNORMAL LOW (ref >59)
GFR calc non Af Amer: 48 mL/min/{1.73_m2} — ABNORMAL LOW (ref >59)
Glucose: 128 mg/dL — ABNORMAL HIGH (ref 65–99)
Potassium: 3.3 mmol/L — ABNORMAL LOW (ref 3.5–5.2)
Sodium: 146 mmol/L — ABNORMAL HIGH (ref 134–144)

## 2020-07-26 LAB — PRO B NATRIURETIC PEPTIDE: NT-Pro BNP: 853 pg/mL — ABNORMAL HIGH (ref 0–738)

## 2020-07-26 LAB — MAGNESIUM: Magnesium: 1.6 mg/dL (ref 1.6–2.3)

## 2020-07-27 ENCOUNTER — Other Ambulatory Visit: Payer: Self-pay | Admitting: Cardiology

## 2020-07-27 DIAGNOSIS — I5041 Acute combined systolic (congestive) and diastolic (congestive) heart failure: Secondary | ICD-10-CM

## 2020-07-27 DIAGNOSIS — E876 Hypokalemia: Secondary | ICD-10-CM

## 2020-07-27 MED ORDER — POTASSIUM CHLORIDE ER 20 MEQ PO TBCR: 20.0000 meq | EXTENDED_RELEASE_TABLET | Freq: Every day | ORAL | 0 refills | Status: AC

## 2020-07-27 MED ORDER — MAGNESIUM OXIDE 400 MG PO CAPS: 400.0000 mg | ORAL_CAPSULE | Freq: Four times a day (QID) | ORAL | 2 refills | Status: AC

## 2020-07-27 NOTE — Progress Notes (Signed)
Called the patient again and informed her of the following.  Add K-Dur 20 milliequivalents p.o. daily Increase magnesium to 400 mg p.o. 4 times daily. Blood work in one week.

## 2020-07-27 NOTE — Progress Notes (Signed)
Spoke to the patient regarding her recent blood work. She is feels well after up titration of Entresto. She is asked to increase magnesium to 400 mg 2 tablets 3 times a day.  We will add K-Dur 40 mg p.o. daily.. Blood work in 1 week to reevaluate kidney function and electrolytes. She has a follow-up appointment in January 2022.

## 2020-08-06 LAB — BASIC METABOLIC PANEL
BUN/Creatinine Ratio: 15 (ref 12–28)
BUN: 18 mg/dL (ref 8–27)
CO2: 23 mmol/L (ref 20–29)
Calcium: 9.1 mg/dL (ref 8.7–10.3)
Chloride: 110 mmol/L — ABNORMAL HIGH (ref 96–106)
Creatinine, Ser: 1.17 mg/dL — ABNORMAL HIGH (ref 0.57–1.00)
GFR calc Af Amer: 50 mL/min/{1.73_m2} — ABNORMAL LOW (ref >59)
GFR calc non Af Amer: 44 mL/min/{1.73_m2} — ABNORMAL LOW (ref >59)
Glucose: 96 mg/dL (ref 65–99)
Potassium: 4.6 mmol/L (ref 3.5–5.2)
Sodium: 143 mmol/L (ref 134–144)

## 2020-08-06 LAB — MAGNESIUM: Magnesium: 1.8 mg/dL (ref 1.6–2.3)

## 2020-08-07 ENCOUNTER — Other Ambulatory Visit: Payer: Self-pay | Admitting: Cardiology

## 2020-08-07 DIAGNOSIS — I428 Other cardiomyopathies: Secondary | ICD-10-CM

## 2020-08-07 DIAGNOSIS — I5041 Acute combined systolic (congestive) and diastolic (congestive) heart failure: Secondary | ICD-10-CM

## 2020-08-11 ENCOUNTER — Other Ambulatory Visit: Payer: Self-pay

## 2020-08-11 ENCOUNTER — Ambulatory Visit: Payer: Medicare HMO | Admitting: Cardiology

## 2020-08-11 ENCOUNTER — Encounter: Payer: Self-pay | Admitting: Cardiology

## 2020-08-11 VITALS — BP 136/65 | HR 80 | Ht 62.0 in | Wt 125.0 lb

## 2020-08-11 DIAGNOSIS — Z8673 Personal history of transient ischemic attack (TIA), and cerebral infarction without residual deficits: Secondary | ICD-10-CM

## 2020-08-11 DIAGNOSIS — Z87891 Personal history of nicotine dependence: Secondary | ICD-10-CM

## 2020-08-11 DIAGNOSIS — I447 Left bundle-branch block, unspecified: Secondary | ICD-10-CM

## 2020-08-11 DIAGNOSIS — I10 Essential (primary) hypertension: Secondary | ICD-10-CM

## 2020-08-11 DIAGNOSIS — I428 Other cardiomyopathies: Secondary | ICD-10-CM

## 2020-08-11 DIAGNOSIS — Z01810 Encounter for preprocedural cardiovascular examination: Secondary | ICD-10-CM

## 2020-08-11 DIAGNOSIS — E876 Hypokalemia: Secondary | ICD-10-CM

## 2020-08-11 DIAGNOSIS — I5042 Chronic combined systolic (congestive) and diastolic (congestive) heart failure: Secondary | ICD-10-CM

## 2020-08-11 DIAGNOSIS — E782 Mixed hyperlipidemia: Secondary | ICD-10-CM

## 2020-08-11 NOTE — Progress Notes (Signed)
Anna Salazar Date of Birth: 05/09/1941 MRN: [id number] Primary Care Provider:Moreira, Carloyn Manner, MD Former Cardiology Providers: Dr. Adrian Prows Primary Cardiologist: Rex Kras, DO, [facility] (established care 01/11/2020)  Date: 08/11/20 Last Office Visit: 07/13/2020  Chief Complaint  Patient presents with  . Congestive Heart Failure    HPI  Anna Salazar is a 80 y.o.  female who presents to the office with a chief complaint of " management of congestive heart failure" Patient's past medical history and cardiovascular risk factors include: hypertension, hyperlipidemia, previous history of TIA, chronic LBBBB, seizure disorder, very mild CAD by coronary angiogram, nonischemic cardiomyopathy, HFrEF/stage B/NYHA class II/III, former smoker, postmenopausal female, advanced age.  Patient is accompanied by her son (Ed) at today's office visit.  Patient was originally referred to the office in June 2021 for preoperative risk stratification.  However, due to reduced functional activity she underwent echo and stress test. Echo noted moderately reduced LVEF and valvular heart disease and stress test noted reversible ischemia as well. She underwent LHC and was noted to have nonobstructive CAD with LVEF per LV gram of 35-45%.    Patient was started on guideline directed medical therapy and it has been uptitrated over the last several office visits.  At the last office visit patient was stating that she has had increased fluid intake and also had gained weight.  We increased her Entresto to 97/103 mg p.o. twice daily.  Repeat blood work noted electrolyte imbalances such as hypokalemia and hypomagnesemia.  They have been corrected with supplementation.  Clinically she denies shortness of breath, orthopnea, paroxysmal nocturnal dyspnea or lower extremity swelling.  Patient states that she does not want to uptitrate her beta-blocker medical therapy anymore and does not want to follow-up regularly as office visits  are cost prohibitive.  Patient states that her PCP has been following her as well and will have him uptitrate her heart failure medications.  She also does not want to participate in Care management for congestive heart failure due to cost.  Patient states that she has been wearing upper and lower dentures for several years now.  However, she recently started having eruptions of her wisdom teeth.  She is requesting them to be extracted but her oral surgeon is requesting preop risk stratification.  She plans to undergo teeth extraction with Dr. Costella Hatcher at [facility].   No use of sublingual nitroglycerin tablets since last office visit.  ALLERGIES: Allergies  Allergen Reactions  . Morphine And Related Other (See Comments)    AMS  . Sulfa Antibiotics Hives  . Tegretol [Carbamazepine] Hives   MEDICATION LIST PRIOR TO VISIT: Current Outpatient Medications on File Prior to Visit  Medication Sig Dispense Refill  . acetaminophen (TYLENOL) 325 MG tablet Take 650 mg by mouth daily as needed for mild pain (headaches.).     Marland Kitchen aspirin EC 81 MG tablet Take 81 mg by mouth every evening.     Marland Kitchen atorvastatin (LIPITOR) 40 MG tablet Take 40 mg by mouth at bedtime.    . carvedilol (COREG) 6.25 MG tablet Take 6.25 mg by mouth 2 (two) times daily with a meal.     . clonazePAM (KLONOPIN) 0.5 MG tablet Take 0.5 mg by mouth 2 (two) times daily.     . clopidogrel (PLAVIX) 75 MG tablet Take 75 mg by mouth daily at 6 (six) AM.     . ENTRESTO 97-103 MG TAKE 1 TABLET BY MOUTH TWICE A DAY 60 tablet 0  .  fenofibrate 160 MG tablet Take 160 mg by mouth daily.    . Magnesium Oxide 400 MG CAPS Take 1 capsule (400 mg total) by mouth in the morning, at noon, in the evening, and at bedtime. 120 capsule 2  . nitroGLYCERIN (NITROSTAT) 0.4 MG SL tablet Place 0.4 mg under the tongue every 5 (five) minutes as needed for chest pain.    . potassium chloride 20 MEQ TBCR Take 20 mEq by mouth daily. 90  tablet 0  . spironolactone (ALDACTONE) 25 MG tablet Take 0.5 tablets (12.5 mg total) by mouth in the morning for 90 doses. 45 tablet 0  . Vitamin D3 (VITAMIN D) 25 MCG tablet Take 1,000 Units by mouth daily.     No current facility-administered medications on file prior to visit.    PAST MEDICAL HISTORY: Past Medical History:  Diagnosis Date  . Arthritis   . Brittle bones   . CHF (congestive heart failure) ([place])   . Coronary artery disease   . Depression   . Headache   . Hyperlipidemia   . Hypertension   . Seizures ([place])   . TIA (transient ischemic attack)     PAST SURGICAL HISTORY: Past Surgical History:  Procedure Laterality Date  . ABDOMINAL HYSTERECTOMY    . APPENDECTOMY    . CARDIAC CATHETERIZATION    . CHOLECYSTECTOMY    . FRACTURE SURGERY     ankle right  . LEFT HEART CATH AND CORONARY ANGIOGRAPHY N/A 01/26/2020   Procedure: LEFT HEART CATH AND CORONARY ANGIOGRAPHY;  Surgeon: Nigel Mormon, MD;  Location: [place] [facility];  Service: Cardiovascular;  Laterality: N/A;  . PATELLA FRACTURE SURGERY     1964    FAMILY HISTORY: The patient's family history includes Cancer in her sister.   SOCIAL HISTORY:  The patient  reports that she quit smoking about 56 years ago. Her smoking use included cigarettes. She has a 0.13 pack-year smoking history. She has never used smokeless tobacco. She reports that she does not drink alcohol and does not use drugs.  Review of Systems  Constitutional: Positive for weight loss. Negative for chills, fever and malaise/fatigue.  HENT: Negative for hoarse voice and nosebleeds.   Eyes: Negative for discharge, double vision and pain.  Cardiovascular: Positive for dyspnea on exertion (improving). Negative for chest pain, claudication, leg swelling, near-syncope, orthopnea, palpitations, paroxysmal nocturnal dyspnea and syncope.  Respiratory: Positive for shortness of breath. Negative for hemoptysis.   Musculoskeletal: Positive for  joint pain. Negative for muscle cramps and myalgias.  Gastrointestinal: Negative for abdominal pain, constipation, diarrhea, hematemesis, hematochezia, melena, nausea and vomiting.  Neurological: Negative for dizziness and light-headedness.   PHYSICAL EXAM: Vitals with BMI 08/11/2020 07/13/2020 07/13/2020  Height 5' 2"  - 5' 2"   Weight 125 lbs - 130 lbs  BMI 91.79 - 15.05  Systolic 697 948 016  Diastolic 65 73 70  Pulse 80 69 68   CONSTITUTIONAL: Elderly appearing female, ambulating with a cane, hemodynamically stable, no acute distress.   SKIN: Skin is warm and dry. No rash noted. No cyanosis. No pallor. No jaundice HEAD: Normocephalic and atraumatic.  EYES: No scleral icterus MOUTH/THROAT: Moist oral membranes.  NECK: No JVD present. No thyromegaly noted. No carotid bruits  LYMPHATIC: No visible cervical adenopathy.  CHEST Normal respiratory effort. No intercostal retractions  LUNGS: Clear to auscultation bilaterally.  No stridor. No wheezes. No rales.  CARDIOVASCULAR: Regular rate rhythm, positive S1-S2, no murmurs rubs or gallops appreciated ABDOMINAL: No apparent ascites.  EXTREMITIES:  Trace bilateral pitting edema.  Warm to touch. HEMATOLOGIC: No significant bruising NEUROLOGIC: Oriented to person, place, and time. Nonfocal. Normal muscle tone.  PSYCHIATRIC: Normal mood and affect. Normal behavior. Cooperative  CARDIAC DATABASE: EKG: 01/11/2020: Normal sinus rhythm, 85 bpm, left bundle branch block, poor R wave progression, without underlying injury pattern.   Echocardiogram: 12/27/2017: LVEF 50%, LV cavity size normal, abnormal septal motion due to LBBB, grade 1 diastolic impairment, mild MR, mild TR, no pulmonary hypertension.  01/14/2020: LVEF 35%, moderate global and severe anterior hypokinesis, mild LVH, grade 1 diastolic impairment, normal LAP, mild left atrial dilatation, mild to moderate AR, moderate MR, moderate TR, moderate pulmonary hypertension, RVSP 50  mmHg.  07/05/2020: Left ventricle cavity is moderately dilated. Normal left ventricular wall thickness. Moderate global hypokinesis, LVEF 35-40%. Doppler evidence of grade I (impaired) diastolic dysfunction, normal LAP.  Left atrial cavity is mildly dilated. Structurally normal trileaflet aortic valve. Moderate (Grade II) aortic regurgitation. Moderate (Grade II) mitral regurgitation. Mild tricuspid regurgitation.  No evidence of pulmonary hypertension. Unlike previous study on 01/14/2020, pulmonary hypertension (estimated PASP 50 mmHg) not seen on this study.  Stress Testing:  Lexiscan Tetrofosmin Stress Test  01/18/2020: Non-diagnostic ECG stress. Myocardial perfusion is abnormal. There is a moderate degree large extent defect suggestive of scar with superimposed reversible defect in the inferior, septal and apical regions.  Gated SPECT imaging of the left ventricle was abnormal,  demonstrating global hypokinesis. Severely enlarged left ventricle. Overall LV systolic function is abnormal without regional wall motion abnormalities. Global hypokinesis of the left ventricle. Stress LV EF: 23%.  High risk study.  Compared to 12/16/2017 report, LVEF 34%, and no definite ischemia noted. Clinical correlation recommended.   Left heart catheterization by Dr. Joya Gaskins Patwardhan 01/26/2020:  LM: Normal  LAD: Minimal luminal irregularities  LCx: High OM1. Minimal luminal irregularities  RCA: Minimal luminal irregularities  The left ventricular ejection fraction is 35-45% by visual estimate.  Normal LVEDP   Event Monitor  01/03/2018-02/01/2018:  Baseline heart rate normal sinus rhythm. 6 patient triggered events occurred without any reported symptoms correlating with normal sinus rhythm. When autodetect at event occurred correlating with normal sinus rhythm with artifact. Lowest bradycardic heart rate was 49 bpm on 6/12 at 2:44 PM. Fastest tachycardia event was 107 bpm on 6/95 10:39 AM. No A. fib or SVT  was noted  LABORATORY DATA: CBC Latest Ref Rng & Units 01/29/2020 01/05/2020 05/21/2012  WBC 3.4 - 10.8 x10E3/uL 5.8 5.4 7.4  Hemoglobin 11.1 - 15.9 g/dL 12.6 11.3(L) 11.4(L)  Hematocrit 34.0 - 46.6 % 37.4 34.7(L) 34.2(L)  Platelets 150 - 450 x10E3/uL 216 208 221    CMP Latest Ref Rng & Units 08/05/2020 07/25/2020 04/05/2020  Glucose 65 - 99 mg/dL 96 128(H) 104(H)  BUN 8 - 27 mg/dL 18 16 16   Creatinine 0.57 - 1.00 mg/dL 1.17(H) 1.08(H) 1.07(H)  Sodium 134 - 144 mmol/L 143 146(H) 142  Potassium 3.5 - 5.2 mmol/L 4.6 3.3(L) 4.0  Chloride 96 - 106 mmol/L 110(H) 109(H) 107(H)  CO2 20 - 29 mmol/L 23 24 23   Calcium 8.7 - 10.3 mg/dL 9.1 9.2 9.2  Total Protein 6.0 - 8.3 g/dL - - -  Total Bilirubin 0.3 - 1.2 mg/dL - - -  Alkaline Phos 39 - 117 U/L - - -  AST 0 - 37 U/L - - -  ALT 0 - 35 U/L - - -    Lipid Panel     Component Value Date/Time   CHOL 105 05/20/2012  0630   TRIG 73 05/20/2012 0630   HDL 50 05/20/2012 0630   CHOLHDL 2.1 05/20/2012 0630   VLDL 15 05/20/2012 0630   LDLCALC 40 05/20/2012 0630    Lab Results  Component Value Date   HGBA1C 5.9 (H) 05/20/2012   No components found for: NTPROBNP Lab Results  Component Value Date   TSH 1.648 05/20/2012   TSH 0.698 03/07/2010    External Labs: Collected: 06/16/2020. Hemoglobin 11.5 g/dL.  Hematocrit 34.9%.   Creatinine 1.1 mg/dL. eGFR: 47 mL/min per 1.73 m Lipid profile: Total cholesterol 98, triglycerides 67, HDL 44,LDL 41 ProBNP: 485  External Labs: Collected: 07/01/2020 Creatinine 0.99 mg/dL. eGFR: 53 mL/min per 1.73 m Hemoglobin 11.5 g/dL.  IMPRESSION:    ICD-10-CM   1. Chronic combined systolic and diastolic congestive heart failure, NYHA class 2 (HCC)  X32.35 Basic metabolic panel    Magnesium    Pro b natriuretic peptide (BNP)  2. Nonischemic cardiomyopathy (HCC)  I42.8   3. Preoperative cardiovascular examination  Z01.810   4. Benign hypertension  I10   5. Mixed hyperlipidemia  E78.2   6. LBBB (left  bundle branch block)  I44.7   7. Former smoker  Z87.891   69. History of TIA (transient ischemic attack)  Z86.73   9. Hypomagnesemia  E83.42   10. Hypokalemia  E87.6      RECOMMENDATIONS: MESHIA RAU is a 80 y.o. 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  Chronic combined systolic and diastolic heart failure, stage B, NYHA class III:  Medications reconciled.  Continue Entresto 97/103 mg p.o. twice daily.   Continue Aldactone 12.5 mg p.o. daily.    Independently reviewed the labs from 08/05/2020.  Potassium is now within normal limits and shows magnesium.  Kidney function remains relatively stable.  Plans were to uptitrate her spironolactone to 25 mg p.o. daily.  At the follow-up visit to initiate Farxiga.  However, patient does not want to follow-up in the office on a regular basis due to the cost incurred by office visits.  Patient states that she will be following up with her PCP and she would like her PCP to uptitrate the medications.  She does not mind seeing me in the office every 6 months or sooner  if she deems appropriate.  She also does not want to be followed by physical care management and she is turning in her weight scale as well.  Recommend daily weight check, strict I/O's Fluid restriction to <2L per day, Na restriction < 2g per day She is more than welcome to call if any questions or concerns arise. Blood work prior to the next office visit.   Preoperative risk stratification:  She has new wisdom teeth that are extremely painful for her and she wants them extracted.    A preoperative risk stratification is requested by her oral surgeon.   She is atleast moderate cardiac risk for the planned  noncardiac surgery.  She is optimized from a cardiovascular standpoint.  And the tooth extraction procedure is not prohibitive.   This preoperative risk assessment is a tool to assist the surgeon in estimating the cardiac risk for the proposed upcoming noncardiac surgery.  The shared decision to proceed with surgery will be ultimately at the discretion of the patient after the surgical risks, benefits, and alternatives  have been discussed amongst the patient and her surgical team.  Nonischemic cardiomyopathy: See above  History of TIA: Currently on dual antiplatelet therapy.  Currently on statin therapy.  Benign essential hypertension:  Continue her home medications.  Currently managed by per primary team.  Left bundle branch block: Chronic and stable.   FINAL MEDICATION LIST END OF ENCOUNTER: No orders of the defined types were placed in this encounter.  There are no discontinued medications.   Current Outpatient Medications:  .  acetaminophen (TYLENOL) 325 MG tablet, Take 650 mg by mouth daily as needed for mild pain (headaches.). , Disp: , Rfl:  .  aspirin EC 81 MG tablet, Take 81 mg by mouth every evening. , Disp: , Rfl:  .  atorvastatin (LIPITOR) 40 MG tablet, Take 40 mg by mouth at bedtime., Disp: , Rfl:  .  carvedilol (COREG) 6.25 MG tablet, Take 6.25 mg by mouth 2 (two) times daily with a meal. , Disp: , Rfl:  .  clonazePAM (KLONOPIN) 0.5 MG tablet, Take 0.5 mg by mouth 2 (two) times daily. , Disp: , Rfl:  .  clopidogrel (PLAVIX) 75 MG tablet, Take 75 mg by mouth daily at 6 (six) AM. , Disp: , Rfl:  .  ENTRESTO 97-103 MG, TAKE 1 TABLET BY MOUTH TWICE A DAY, Disp: 60 tablet, Rfl: 0 .  fenofibrate 160 MG tablet, Take 160 mg by mouth daily., Disp: , Rfl:  .  Magnesium Oxide 400 MG CAPS, Take 1 capsule (400 mg total) by mouth in the morning, at noon, in the evening, and at bedtime., Disp: 120 capsule, Rfl: 2 .  nitroGLYCERIN (NITROSTAT) 0.4 MG SL tablet, Place 0.4 mg under the  tongue every 5 (five) minutes as needed for chest pain., Disp: , Rfl:  .  potassium chloride 20 MEQ TBCR, Take 20 mEq by mouth daily., Disp: 90 tablet, Rfl: 0 .  spironolactone (ALDACTONE) 25 MG tablet, Take 0.5 tablets (12.5 mg total) by mouth in the morning for 90 doses., Disp: 45 tablet, Rfl: 0 .  Vitamin D3 (VITAMIN D) 25 MCG tablet, Take 1,000 Units by mouth daily., Disp: , Rfl:   Orders Placed This Encounter  Procedures  . Basic metabolic panel  . Magnesium  . Pro b natriuretic peptide (BNP)   --Continue cardiac medications as reconciled in final medication list. --Return in about 6 months (around 02/08/2021) for Follow up, heart failure management.. Or sooner if needed. --Continue follow-up with your primary care physician regarding the management of your other chronic comorbid conditions.  Patient's questions and concerns were addressed to her satisfaction. She voices understanding of the instructions provided during this encounter.   This note was created using a voice recognition software as a result there may be grammatical errors inadvertently enclosed that do not reflect the nature of this encounter. Every attempt is made to correct such errors.  Rex Kras, [place], [facility]  Pager: [phone] Office: [phone]

## 2020-09-02 ENCOUNTER — Other Ambulatory Visit: Payer: Self-pay | Admitting: Cardiology

## 2020-09-02 DIAGNOSIS — I428 Other cardiomyopathies: Secondary | ICD-10-CM

## 2020-09-02 DIAGNOSIS — I5041 Acute combined systolic (congestive) and diastolic (congestive) heart failure: Secondary | ICD-10-CM

## 2020-09-30 ENCOUNTER — Other Ambulatory Visit: Payer: Self-pay | Admitting: Cardiology

## 2020-09-30 DIAGNOSIS — I428 Other cardiomyopathies: Secondary | ICD-10-CM

## 2020-09-30 DIAGNOSIS — I5041 Acute combined systolic (congestive) and diastolic (congestive) heart failure: Secondary | ICD-10-CM

## 2020-11-01 ENCOUNTER — Other Ambulatory Visit: Payer: Self-pay | Admitting: Cardiology

## 2020-11-01 DIAGNOSIS — I5041 Acute combined systolic (congestive) and diastolic (congestive) heart failure: Secondary | ICD-10-CM

## 2020-11-01 DIAGNOSIS — I428 Other cardiomyopathies: Secondary | ICD-10-CM

## 2020-12-02 ENCOUNTER — Other Ambulatory Visit: Payer: Self-pay | Admitting: Cardiology

## 2020-12-02 DIAGNOSIS — I428 Other cardiomyopathies: Secondary | ICD-10-CM

## 2020-12-02 DIAGNOSIS — I5041 Acute combined systolic (congestive) and diastolic (congestive) heart failure: Secondary | ICD-10-CM

## 2020-12-29 ENCOUNTER — Other Ambulatory Visit: Payer: Self-pay | Admitting: Cardiology

## 2020-12-29 DIAGNOSIS — I5041 Acute combined systolic (congestive) and diastolic (congestive) heart failure: Secondary | ICD-10-CM

## 2020-12-29 DIAGNOSIS — I428 Other cardiomyopathies: Secondary | ICD-10-CM

## 2021-02-01 ENCOUNTER — Other Ambulatory Visit: Payer: Self-pay | Admitting: Cardiology

## 2021-02-01 DIAGNOSIS — I5041 Acute combined systolic (congestive) and diastolic (congestive) heart failure: Secondary | ICD-10-CM

## 2021-02-01 DIAGNOSIS — I428 Other cardiomyopathies: Secondary | ICD-10-CM

## 2021-02-09 ENCOUNTER — Ambulatory Visit: Payer: Medicare HMO | Admitting: Cardiology

## 2021-04-05 ENCOUNTER — Other Ambulatory Visit: Payer: Self-pay | Admitting: Cardiology

## 2021-04-05 DIAGNOSIS — I5023 Acute on chronic systolic (congestive) heart failure: Secondary | ICD-10-CM

## 2021-04-05 DIAGNOSIS — I428 Other cardiomyopathies: Secondary | ICD-10-CM

## 2021-04-08 ENCOUNTER — Other Ambulatory Visit: Payer: Self-pay | Admitting: Cardiology

## 2021-04-08 DIAGNOSIS — I5041 Acute combined systolic (congestive) and diastolic (congestive) heart failure: Secondary | ICD-10-CM

## 2021-04-08 DIAGNOSIS — I428 Other cardiomyopathies: Secondary | ICD-10-CM

## 2021-05-09 ENCOUNTER — Other Ambulatory Visit: Payer: Self-pay | Admitting: Cardiology

## 2021-05-09 DIAGNOSIS — I428 Other cardiomyopathies: Secondary | ICD-10-CM

## 2021-05-09 DIAGNOSIS — I5041 Acute combined systolic (congestive) and diastolic (congestive) heart failure: Secondary | ICD-10-CM

## 2021-08-18 ENCOUNTER — Other Ambulatory Visit: Payer: Self-pay | Admitting: Cardiology

## 2021-08-18 DIAGNOSIS — I428 Other cardiomyopathies: Secondary | ICD-10-CM

## 2021-08-18 DIAGNOSIS — I5041 Acute combined systolic (congestive) and diastolic (congestive) heart failure: Secondary | ICD-10-CM

## 2021-08-23 IMAGING — CT CT HEAD WO/W CM
4 of 5 series · 17 of 47 positions shown, 19 images · IV contrast (iopamidol)
Comparison: CT head 05/19/2012.  MRI head 05/20/2012

CLINICAL DATA: Headache. Vertigo. History of seizure. Fall. History
of breast cancer

EXAM:
CT HEAD WITHOUT AND WITH CONTRAST
TECHNIQUE: Contiguous axial images were obtained from the base of the skull
through the vertex without and with intravenous contrast
CONTRAST:  75mL [id number] IOPAMIDOL ([id number]) INJECTION 61%

[Series 2: brain 5.00 hr40 s3 ibhc · axial · 0.40mm/px · z∈[-653,-533]mm · 8 of 32 slices shown, 10 images]
[im 4/32  brain]
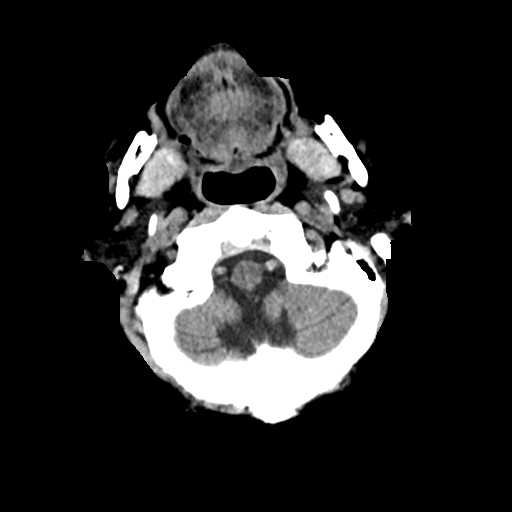
[im 4/32  bone]
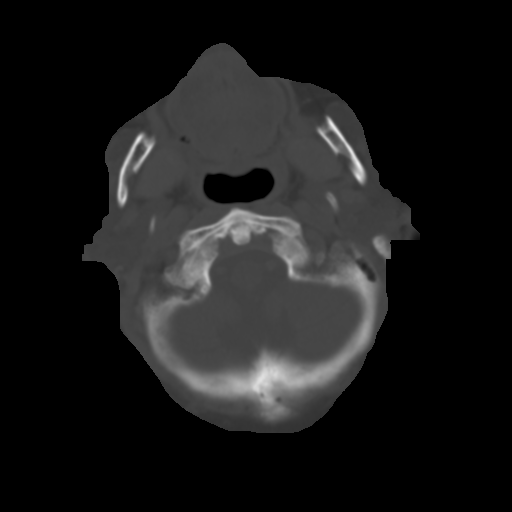
[im 7/32  brain]
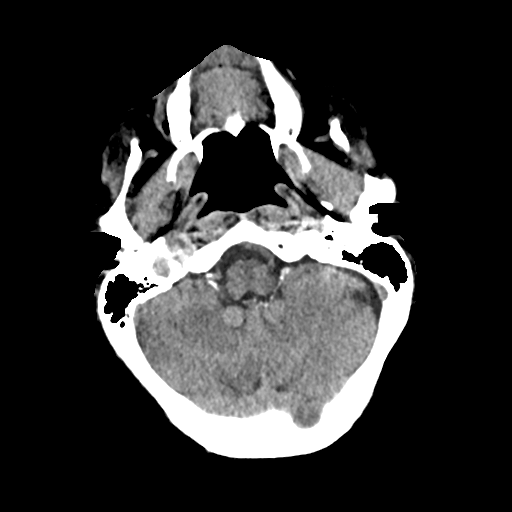
[im 11/32  brain]
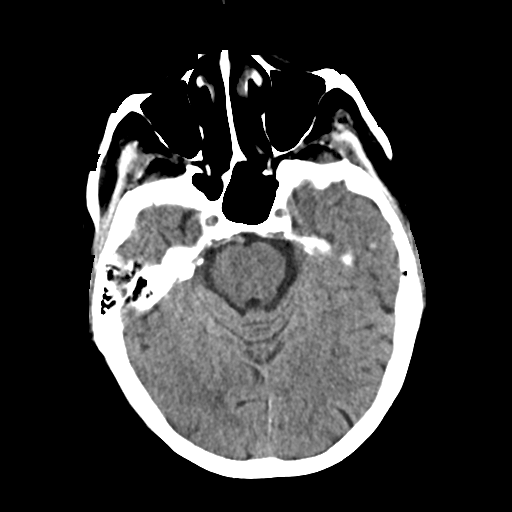
[im 14/32  brain]
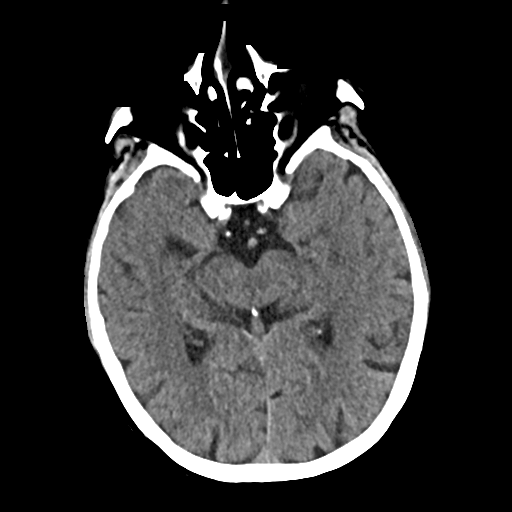
[im 18/32  brain]
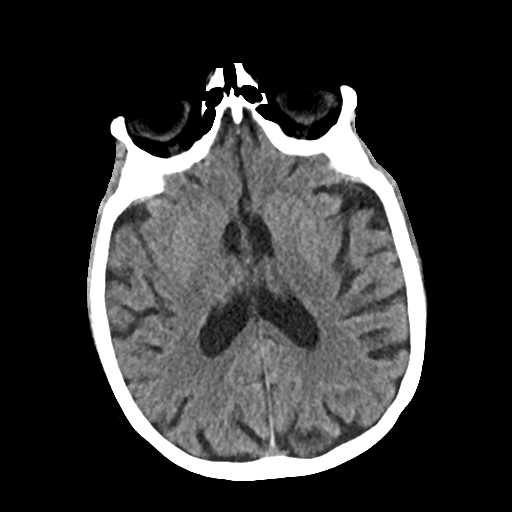
[im 18/32  bone]
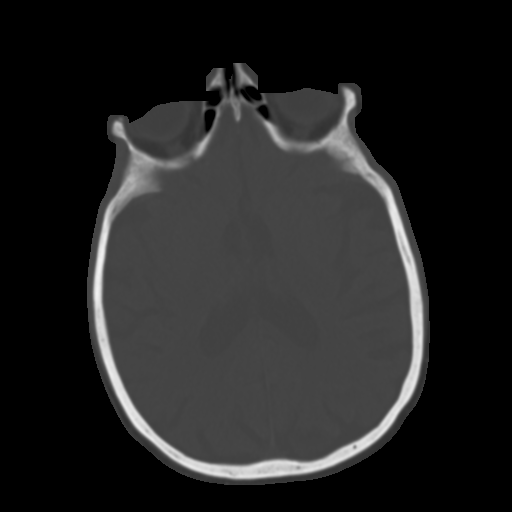
[im 21/32  brain]
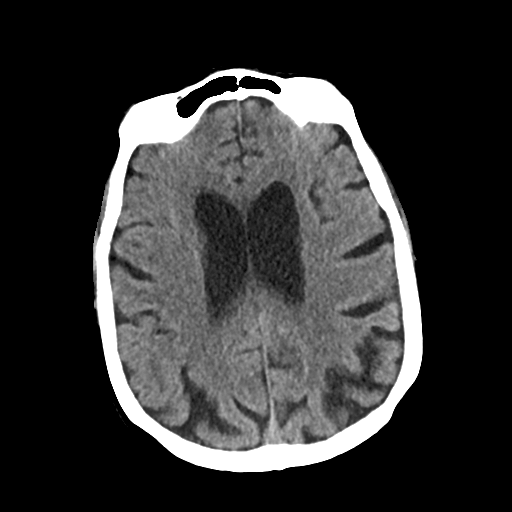
[im 25/32  brain]
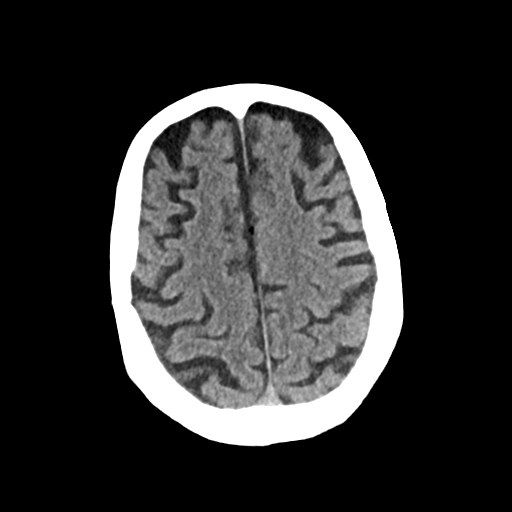
[im 28/32  brain]
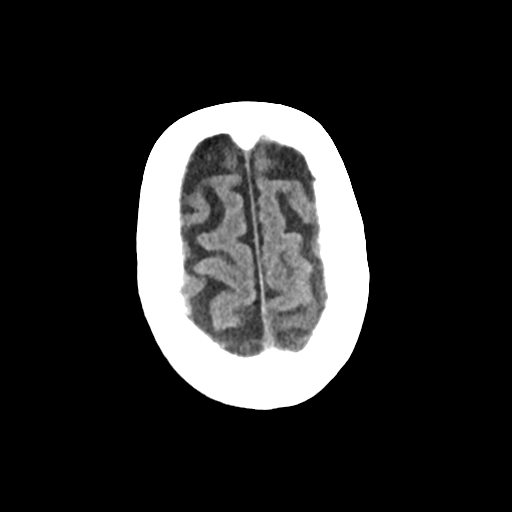

[Series 3: brain 5.00 hr60 s3 axial · axial · 0.40mm/px · z∈[-653,-618]mm · 3 of 32 slices shown]
[im 4/32  brain]
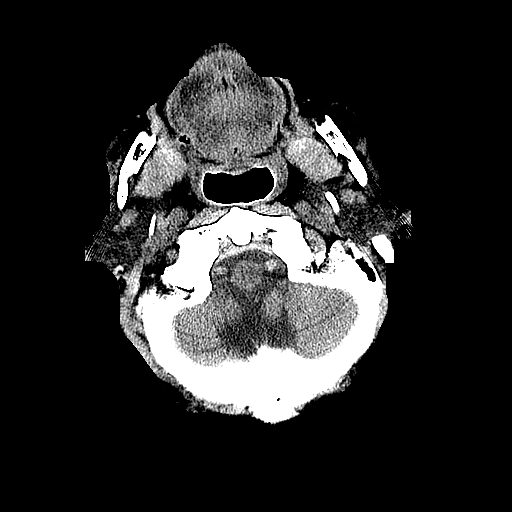
[im 7/32  brain]
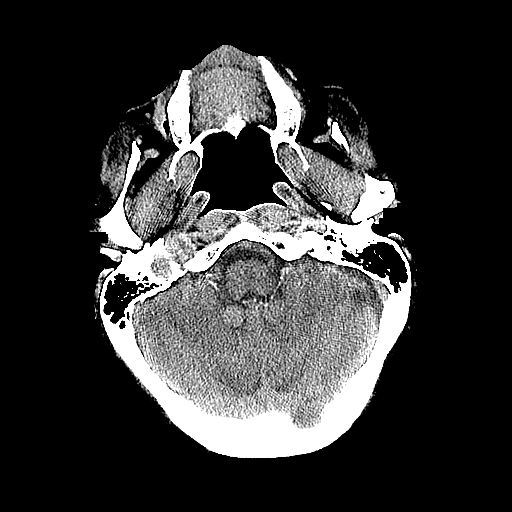
[im 11/32  brain]
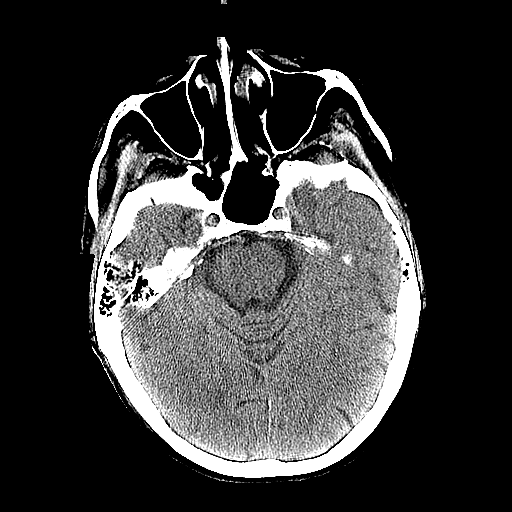

[Series 5: brain 3.00 hr40 s3 cor ibhc · coronal · 0.34mm/px · 3 of 66 slices shown]
[im 24/66  brain]
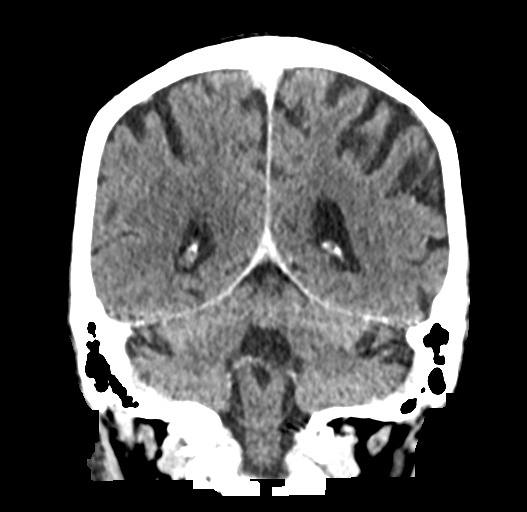
[im 30/66  brain]
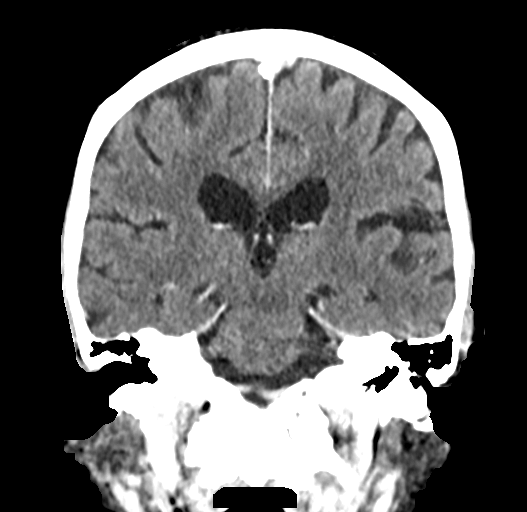
[im 36/66  brain]
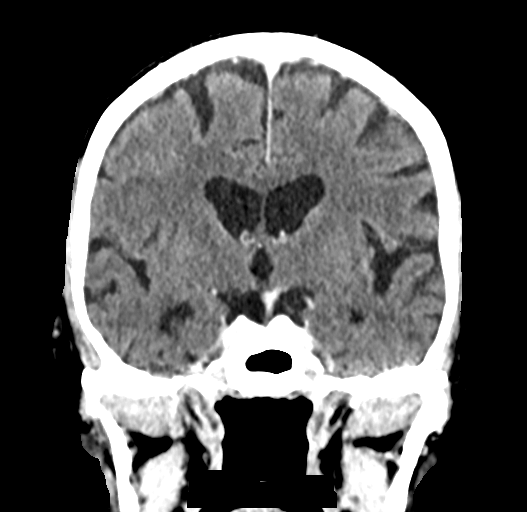

[Series 7: brain 3.00 hr40 s3 sag ibhc · sagittal · 0.33mm/px · 3 of 59 slices shown]
[im 20/59  brain]
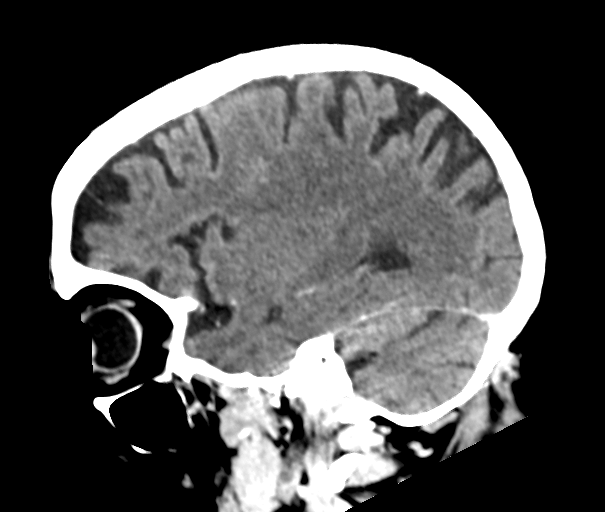
[im 30/59  brain]
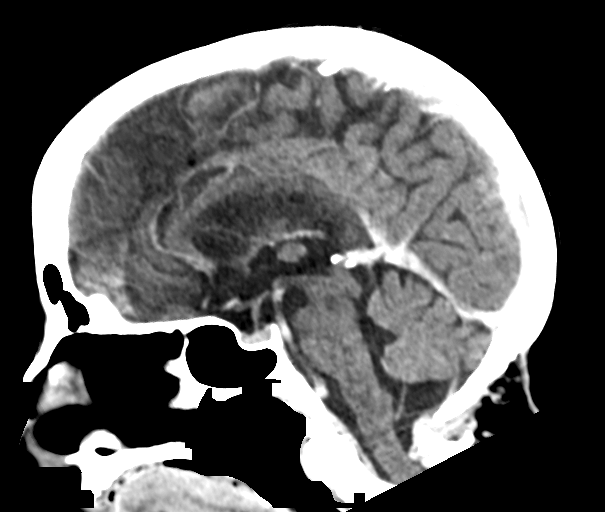
[im 39/59  brain]
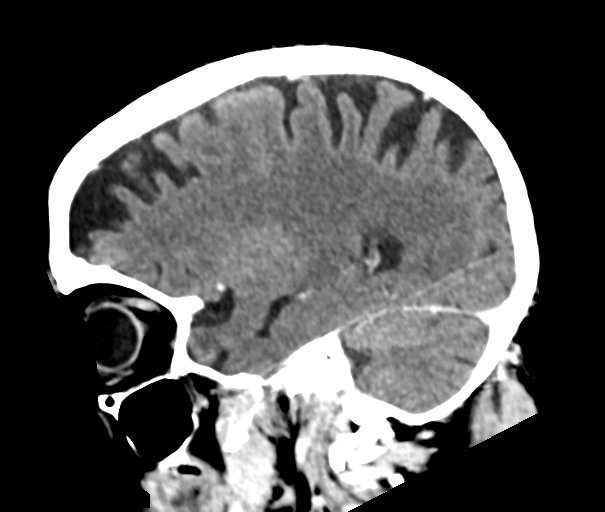

[17 of 47 positions shown; findings below may reference images not displayed]

FINDINGS: Brain: Generalized atrophy with mild progression. Negative for acute
infarct, hemorrhage, mass. No significant chronic ischemic change

Normal enhancement postcontrast administration.

Vascular: Negative for hyperdense vessel. Normal vascular
enhancement.

Skull: Negative

Sinuses/Orbits: Paranasal sinuses clear.  Negative orbit

Other: None
IMPRESSION: Generalized atrophy.  No acute or focal abnormality.

## 2022-01-24 ENCOUNTER — Telehealth: Payer: Self-pay | Admitting: Cardiology

## 2022-01-24 NOTE — Telephone Encounter (Signed)
Called patient, lvm for her to call us back and schedule f/u for clearance.

## 2022-01-25 NOTE — Telephone Encounter (Signed)
I tried calling her yesterday on 01/24/2022 but no response.  Please reach out - she can come in today.   ST

## 2022-01-26 NOTE — Telephone Encounter (Signed)
Pt already had procedure done and therefor does not need any clearance from [place].

## 2022-04-19 ENCOUNTER — Other Ambulatory Visit: Payer: Self-pay | Admitting: Cardiology

## 2022-04-19 DIAGNOSIS — I428 Other cardiomyopathies: Secondary | ICD-10-CM

## 2022-04-19 DIAGNOSIS — I5041 Acute combined systolic (congestive) and diastolic (congestive) heart failure: Secondary | ICD-10-CM

## 2022-07-18 ENCOUNTER — Other Ambulatory Visit: Payer: Self-pay | Admitting: Internal Medicine

## 2022-07-18 DIAGNOSIS — R1310 Dysphagia, unspecified: Secondary | ICD-10-CM

## 2022-08-06 ENCOUNTER — Ambulatory Visit
Admission: RE | Admit: 2022-08-06 | Discharge: 2022-08-06 | Disposition: A | Discharge status: Home / Self Care | Payer: Medicare HMO | Source: Ambulatory Visit | Attending: Internal Medicine | Admitting: Internal Medicine

## 2022-08-06 DIAGNOSIS — R1310 Dysphagia, unspecified: Secondary | ICD-10-CM

## 2022-09-04 ENCOUNTER — Other Ambulatory Visit: Payer: Self-pay | Admitting: Internal Medicine

## 2022-09-04 DIAGNOSIS — R10817 Generalized abdominal tenderness: Secondary | ICD-10-CM

## 2022-09-04 DIAGNOSIS — R634 Abnormal weight loss: Secondary | ICD-10-CM

## 2022-09-04 DIAGNOSIS — D649 Anemia, unspecified: Secondary | ICD-10-CM

## 2022-09-04 DIAGNOSIS — I25118 Atherosclerotic heart disease of native coronary artery with other forms of angina pectoris: Secondary | ICD-10-CM

## 2022-09-05 ENCOUNTER — Other Ambulatory Visit: Payer: Medicare HMO

## 2022-09-06 ENCOUNTER — Ambulatory Visit
Admission: RE | Admit: 2022-09-06 | Discharge: 2022-09-06 | Disposition: A | Discharge status: Home / Self Care | Payer: Medicare HMO | Source: Ambulatory Visit | Attending: Internal Medicine | Admitting: Internal Medicine

## 2022-09-06 DIAGNOSIS — R634 Abnormal weight loss: Secondary | ICD-10-CM

## 2022-09-06 DIAGNOSIS — R10817 Generalized abdominal tenderness: Secondary | ICD-10-CM

## 2022-09-06 DIAGNOSIS — I25118 Atherosclerotic heart disease of native coronary artery with other forms of angina pectoris: Secondary | ICD-10-CM

## 2022-09-06 DIAGNOSIS — D649 Anemia, unspecified: Secondary | ICD-10-CM

## 2022-09-06 MED ORDER — IOPAMIDOL (ISOVUE-300) INJECTION 61%
100.0000 mL | Freq: Once | INTRAVENOUS | Status: AC | PRN
  Administered 2022-09-06: 100 mL via INTRAVENOUS

## 2022-10-09 ENCOUNTER — Other Ambulatory Visit: Payer: Self-pay | Admitting: Cardiology

## 2022-10-09 DIAGNOSIS — I428 Other cardiomyopathies: Secondary | ICD-10-CM

## 2022-10-09 DIAGNOSIS — I5041 Acute combined systolic (congestive) and diastolic (congestive) heart failure: Secondary | ICD-10-CM

## 2023-03-13 NOTE — Telephone Encounter (Signed)
Done

## 2023-04-15 ENCOUNTER — Other Ambulatory Visit: Payer: Self-pay | Admitting: Cardiology

## 2023-04-15 DIAGNOSIS — I428 Other cardiomyopathies: Secondary | ICD-10-CM

## 2023-04-15 DIAGNOSIS — I5041 Acute combined systolic (congestive) and diastolic (congestive) heart failure: Secondary | ICD-10-CM

## 2023-04-23 ENCOUNTER — Other Ambulatory Visit: Payer: Self-pay | Admitting: Cardiology

## 2023-04-23 DIAGNOSIS — I428 Other cardiomyopathies: Secondary | ICD-10-CM

## 2023-04-23 DIAGNOSIS — I5041 Acute combined systolic (congestive) and diastolic (congestive) heart failure: Secondary | ICD-10-CM

## 2024-03-16 ENCOUNTER — Other Ambulatory Visit: Payer: Self-pay | Admitting: Internal Medicine

## 2024-03-16 DIAGNOSIS — R519 Headache, unspecified: Secondary | ICD-10-CM

## 2024-03-19 ENCOUNTER — Encounter: Payer: Self-pay | Admitting: Internal Medicine

## 2024-03-24 ENCOUNTER — Ambulatory Visit
Admission: RE | Admit: 2024-03-24 | Discharge: 2024-03-24 | Disposition: A | Discharge status: Home / Self Care | Source: Ambulatory Visit | Attending: Internal Medicine | Admitting: Internal Medicine

## 2024-03-24 DIAGNOSIS — R519 Headache, unspecified: Secondary | ICD-10-CM

## 2024-04-07 ENCOUNTER — Other Ambulatory Visit: Payer: Self-pay | Admitting: Internal Medicine

## 2024-04-07 ENCOUNTER — Ambulatory Visit
Admission: RE | Admit: 2024-04-07 | Discharge: 2024-04-07 | Disposition: A | Discharge status: Home / Self Care | Source: Ambulatory Visit | Attending: Internal Medicine | Admitting: Internal Medicine

## 2024-04-07 DIAGNOSIS — R519 Headache, unspecified: Secondary | ICD-10-CM

## 2024-04-09 ENCOUNTER — Other Ambulatory Visit: Payer: Self-pay | Admitting: Internal Medicine

## 2024-04-09 DIAGNOSIS — R9389 Abnormal findings on diagnostic imaging of other specified body structures: Secondary | ICD-10-CM

## 2024-04-09 DIAGNOSIS — R519 Headache, unspecified: Secondary | ICD-10-CM

## 2024-04-10 ENCOUNTER — Ambulatory Visit
Admission: RE | Admit: 2024-04-10 | Discharge: 2024-04-10 | Disposition: A | Discharge status: Home / Self Care | Source: Ambulatory Visit | Attending: Internal Medicine | Admitting: Internal Medicine

## 2024-04-10 DIAGNOSIS — R9389 Abnormal findings on diagnostic imaging of other specified body structures: Secondary | ICD-10-CM

## 2024-04-10 DIAGNOSIS — R519 Headache, unspecified: Secondary | ICD-10-CM

## 2024-04-10 MED ORDER — GADOPICLENOL 0.5 MMOL/ML IV SOLN
6.0000 mL | Freq: Once | INTRAVENOUS | Status: AC | PRN
  Administered 2024-04-10: 6 mL via INTRAVENOUS

## 2024-04-14 ENCOUNTER — Ambulatory Visit: Admitting: Neurology

## 2024-04-14 ENCOUNTER — Encounter: Payer: Self-pay | Admitting: *Deleted

## 2024-04-14 ENCOUNTER — Encounter: Payer: Self-pay | Admitting: Neurology

## 2024-04-14 ENCOUNTER — Other Ambulatory Visit: Payer: Self-pay | Admitting: Neurology

## 2024-04-14 VITALS — BP 132/66 | HR 45 | Ht 60.0 in | Wt 131.8 lb

## 2024-04-14 DIAGNOSIS — R519 Headache, unspecified: Secondary | ICD-10-CM | POA: Insufficient documentation

## 2024-04-14 DIAGNOSIS — R269 Unspecified abnormalities of gait and mobility: Secondary | ICD-10-CM | POA: Diagnosis not present

## 2024-04-14 DIAGNOSIS — R404 Transient alteration of awareness: Secondary | ICD-10-CM | POA: Insufficient documentation

## 2024-04-14 DIAGNOSIS — R42 Dizziness and giddiness: Secondary | ICD-10-CM

## 2024-04-14 DIAGNOSIS — R55 Syncope and collapse: Secondary | ICD-10-CM

## 2024-04-14 MED ORDER — GABAPENTIN 100 MG PO CAPS: 100.0000 mg | ORAL_CAPSULE | Freq: Three times a day (TID) | ORAL | 5 refills | Status: AC

## 2024-04-14 NOTE — Progress Notes (Unsigned)
 Chief Complaint  Patient presents with   New Patient (Initial Visit)    Pt in room 15. Son Ed in room. Paper referral for spontaneous pneumocephalus.   ASSESSMENT AND PLAN  Anna Salazar is a 83 y.o. 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   MEDICAL HISTORY:  Anna Salazar, seen in request by   Valma Carwin, MD    History is obtained from the patient and review of electronic medical records. I personally reviewed pertinent available imaging films in PACS.   PMHx of  HTN HLD Anxiety Decreased ejection fraction Used to smoke,   Lives with husban dand son, no driaving, sinus,   I reviewed most recent cardiology evaluation by PA Michele Richardson  at [facility] cardio vascular in January 2022, Nonischemic cardiomyopathy,  In August sinuse issue, headaches, dizziness, falling out, sitting still slump over, pass out, j  Few weeks ago, wet cloth wash, came around, could not remember,   Get more fequent in past 3 months,   Shehas seizure, sat in the chairt,   She has seizure 70s, body shook, fall, doing somethings,  couple of them, let them pass by, she  every thing goes black, then she can see again,   2 couple weeks, agoan, recliner chair, watch TV< she has heaaches, slump over, no seizure activires, lasting for 2 couple,    Urinary incontinece x one year, no back pain, no neck pain,   Tylenol    Past Medical History:  Diagnosis Date   Arthritis    Brittle bones    CHF (congestive heart failure) (HCC)    Coronary artery disease    Depression    Headache    Hyperlipidemia    Hypertension    Seizures (HCC)    TIA (transient ischemic attack)    Past Surgical History:  Procedure Laterality Date   ABDOMINAL HYSTERECTOMY     APPENDECTOMY     CARDIAC CATHETERIZATION     CHOLECYSTECTOMY     FRACTURE SURGERY     ankle right   LEFT HEART CATH AND CORONARY ANGIOGRAPHY N/A 01/26/2020   Procedure: LEFT HEART CATH AND CORONARY ANGIOGRAPHY;  Surgeon: Elmira Newman PARAS, MD;  Location: [facility];  Service: Cardiovascular;  Laterality: N/A;   PATELLA FRACTURE SURGERY     1964     PHYSICAL EXAM:   Vitals:   04/14/24 1119  BP: 132/66  Pulse: (!) 45  SpO2: 100%  Weight: 131 lb 12.8 oz (59.8 kg)  Height: 5' (1.524 m)   Body mass index is 25.74 kg/m.  PHYSICAL EXAMNIATION:  Gen: NAD, conversant, well nourised, well groomed                     Cardiovascular: Regular rate rhythm, no peripheral edema, warm, nontender. Eyes: Conjunctivae clear without exudates or hemorrhage Neck: Supple, no carotid bruits. Pulmonary: Clear to auscultation bilaterally   NEUROLOGICAL EXAM:  MENTAL STATUS: Speech/cognition: Awake, alert, oriented to history taking and casual conversation, edentulous CRANIAL NERVES: CN II: Visual fields are full to confrontation. Pupils are round equal and briskly reactive to light. CN III, IV, VI: extraocular movement are normal. No ptosis. CN V: Facial sensation is intact to light touch CN VII: Face is symmetric with normal eye closure  CN VIII: Hearing is normal to causal conversation. CN IX, X: Phonation is normal. CN XI: Head turning and shoulder shrug are intact  MOTOR: Upper extremity motor strength is normal, right knee swelling, scar, exam is limited due to  right knee pain, and limited range of motion,  Mild bilateral ankle dorsiflexion weakness  REFLEXES: Reflexes are 2+ and symmetric at the biceps, triceps, absent at right knee, decreased at left knee absent at ankles. Plantar responses are extensor on left side  SENSORY: Intact to light touch, pinprick and vibratory sensation are intact in fingers and toes.  COORDINATION: There is no trunk or limb dysmetria noted.  GAIT/STANCE: Push-up, reliant on her cane, dragging right leg  REVIEW OF SYSTEMS:  Full 14 system review of systems performed and notable only for as above All other review of systems were negative.   ALLERGIES: Allergies  Allergen Reactions   Morphine And Codeine Other (See Comments)    AMS   Sulfa Antibiotics Hives   Tegretol [Carbamazepine] Hives    HOME MEDICATIONS: Current Outpatient Medications  Medication Sig Dispense Refill   acetaminophen  (TYLENOL ) 325 MG tablet Take 650 mg by mouth daily as needed for mild pain (headaches.).      aspirin  EC 81 MG tablet Take 81 mg by mouth every evening.      atorvastatin (LIPITOR) 40 MG tablet Take 40 mg by mouth at bedtime.     carvedilol (COREG) 6.25 MG tablet Take 6.25 mg by mouth 2 (two) times daily with a meal.      clonazePAM (KLONOPIN) 0.5 MG tablet Take 0.5 mg by mouth 2 (two) times daily.      clopidogrel  (PLAVIX ) 75 MG tablet Take 75 mg by mouth daily at 6 (six) AM.      fenofibrate 160 MG tablet Take 160 mg by mouth daily.     nitroGLYCERIN (NITROSTAT) 0.4 MG SL tablet Place 0.4 mg under the tongue every 5 (five) minutes as needed for chest pain.     Vitamin D3 (VITAMIN D) 25 MCG tablet Take 1,000 Units by mouth daily.     potassium chloride  20 MEQ TBCR Take 20 mEq by mouth daily. (Patient not taking: Reported on 04/14/2024) 90 tablet 0   sacubitril -valsartan  (ENTRESTO ) 97-103 MG TAKE 1 TABLET BY MOUTH TWICE A DAY (Patient not taking: Reported on 04/14/2024) 60 tablet 0   spironolactone  (ALDACTONE ) 25 MG  tablet Take 0.5 tablets (12.5 mg total) by mouth in the morning for 90 doses. (Patient not taking: Reported on 04/14/2024) 45 tablet 0   No current facility-administered medications for this visit.    PAST MEDICAL HISTORY: Past Medical History:  Diagnosis Date   Arthritis    Brittle bones    CHF (congestive heart failure) (HCC)    Coronary artery disease    Depression    Headache    Hyperlipidemia    Hypertension    Seizures (HCC)    TIA (transient ischemic attack)     PAST SURGICAL HISTORY: Past Surgical History:  Procedure Laterality Date   ABDOMINAL HYSTERECTOMY     APPENDECTOMY     CARDIAC CATHETERIZATION  CHOLECYSTECTOMY     FRACTURE SURGERY     ankle right   LEFT HEART CATH AND CORONARY ANGIOGRAPHY N/A 01/26/2020   Procedure: LEFT HEART CATH AND CORONARY ANGIOGRAPHY;  Surgeon: Elmira Newman PARAS, MD;  Location: [facility];  Service: Cardiovascular;  Laterality: N/A;   PATELLA FRACTURE SURGERY     1964    FAMILY HISTORY: Family History  Problem Relation Age of Onset   Cancer Sister     SOCIAL HISTORY: Social History   Socioeconomic History   Marital status: Married    Spouse name: Not on file   Number of children: 3   Years of education: Not on file   Highest education level: Not on file  Occupational History   Not on file  Tobacco Use   Smoking status: Former    Current packs/day: 0.00    Average packs/day: 0.3 packs/day for 0.5 years (0.1 ttl pk-yrs)    Types: Cigarettes    Start date: 01/1964    Quit date: 1966    Years since quitting: 59.7   Smokeless tobacco: Never   Tobacco comments:    smoked for 6 months only   Vaping Use   Vaping status: Never Used  Substance and Sexual Activity   Alcohol use: No   Drug use: No   Sexual activity: Not Currently  Other Topics Concern   Not on file  Social History Narrative   Patient is the last living sibling.    Social Drivers of Corporate investment banker Strain: Not on file  Food  Insecurity: Not on file  Transportation Needs: Not on file  Physical Activity: Not on file  Stress: Not on file  Social Connections: Not on file  Intimate Partner Violence: Not on file      Modena Callander, M.D. Ph.D.  [facility] [address] [place], [place] [address] Ph: [phone] Fax: [phone]  CC:  Valma Carwin, MD [phone] [place],  [place] [address]  Valma Carwin, MD

## 2024-04-15 ENCOUNTER — Other Ambulatory Visit: Payer: Self-pay | Admitting: Cardiology

## 2024-04-15 ENCOUNTER — Telehealth: Payer: Self-pay | Admitting: Neurology

## 2024-04-15 DIAGNOSIS — I5041 Acute combined systolic (congestive) and diastolic (congestive) heart failure: Secondary | ICD-10-CM

## 2024-04-15 DIAGNOSIS — I428 Other cardiomyopathies: Secondary | ICD-10-CM

## 2024-04-15 MED ORDER — SACUBITRIL-VALSARTAN 97-103 MG PO TABS: 1.0000 | ORAL_TABLET | Freq: Two times a day (BID) | ORAL | 0 refills | Status: AC

## 2024-04-15 NOTE — Telephone Encounter (Signed)
 MRI orders sent to [facility] to schedule. [phone]

## 2024-04-17 ENCOUNTER — Encounter: Payer: Self-pay | Admitting: Neurology

## 2024-04-29 ENCOUNTER — Ambulatory Visit
Admission: RE | Admit: 2024-04-29 | Discharge: 2024-04-29 | Disposition: A | Discharge status: Home / Self Care | Source: Ambulatory Visit | Attending: Neurology | Admitting: Neurology

## 2024-04-29 DIAGNOSIS — R404 Transient alteration of awareness: Secondary | ICD-10-CM

## 2024-04-29 DIAGNOSIS — R269 Unspecified abnormalities of gait and mobility: Secondary | ICD-10-CM

## 2024-04-29 DIAGNOSIS — R519 Headache, unspecified: Secondary | ICD-10-CM

## 2024-05-05 ENCOUNTER — Ambulatory Visit: Payer: Self-pay | Admitting: Neurology

## 2024-05-06 NOTE — Telephone Encounter (Signed)
 Pt's son has returned call to [facility], he is asking to be called at [phone]

## 2024-05-07 ENCOUNTER — Other Ambulatory Visit: Admitting: *Deleted

## 2024-05-19 ENCOUNTER — Ambulatory Visit: Attending: Neurology

## 2024-05-19 DIAGNOSIS — R404 Transient alteration of awareness: Secondary | ICD-10-CM

## 2024-05-19 DIAGNOSIS — R519 Headache, unspecified: Secondary | ICD-10-CM

## 2024-05-19 DIAGNOSIS — R42 Dizziness and giddiness: Secondary | ICD-10-CM

## 2024-05-19 DIAGNOSIS — R55 Syncope and collapse: Secondary | ICD-10-CM

## 2024-05-19 DIAGNOSIS — R269 Unspecified abnormalities of gait and mobility: Secondary | ICD-10-CM

## 2024-05-20 ENCOUNTER — Ambulatory Visit (INDEPENDENT_AMBULATORY_CARE_PROVIDER_SITE_OTHER): Admitting: Neurology

## 2024-05-20 DIAGNOSIS — R519 Headache, unspecified: Secondary | ICD-10-CM

## 2024-05-20 DIAGNOSIS — R269 Unspecified abnormalities of gait and mobility: Secondary | ICD-10-CM

## 2024-05-20 DIAGNOSIS — R4182 Altered mental status, unspecified: Secondary | ICD-10-CM | POA: Diagnosis not present

## 2024-05-20 DIAGNOSIS — R404 Transient alteration of awareness: Secondary | ICD-10-CM

## 2024-05-23 DIAGNOSIS — R404 Transient alteration of awareness: Secondary | ICD-10-CM | POA: Diagnosis not present

## 2024-05-23 DIAGNOSIS — R55 Syncope and collapse: Secondary | ICD-10-CM | POA: Diagnosis not present

## 2024-05-23 DIAGNOSIS — R42 Dizziness and giddiness: Secondary | ICD-10-CM

## 2024-05-23 DIAGNOSIS — R269 Unspecified abnormalities of gait and mobility: Secondary | ICD-10-CM

## 2024-05-23 DIAGNOSIS — R519 Headache, unspecified: Secondary | ICD-10-CM

## 2024-05-25 ENCOUNTER — Ambulatory Visit: Payer: Self-pay | Admitting: Neurology

## 2024-05-25 NOTE — Telephone Encounter (Signed)
 Rveiwed results with patient, she verbalized understanding. No further questions.

## 2024-05-25 NOTE — Telephone Encounter (Signed)
-----   Message from Modena Callander sent at 05/25/2024  3:22 PM EDT ----- Please call patient, cardiac monitoring showed no evidence of atrial fibrillation, no 53 triggered events, only rare episode of first-degree AV block, otherwise predominant sinus rhythm  Lateral continue to document her event, we will go over question in detail at next follow-up visit ----- Message ----- From: Michele Richardson, DO Sent: 05/23/2024  11:18 PM EDT To: Modena Callander, MD

## 2024-06-09 NOTE — Procedures (Signed)
   HISTORY: 83 year old female presenting with passing out episode  TECHNIQUE:  This is a routine 16 channel EEG recording with one channel devoted to a limited EKG recording.  It was performed during wakefulness, drowsiness and asleep.  Photic stimulation were performed as activating procedures.  There are frequent bifrontal muscle artifact  Upon maximum arousal, posterior dominant waking rhythm consistent of low amplitude, mildly dysrhythmic alpha and theta range activity. Activities are symmetric over the bilateral posterior derivations and attenuated with eye opening.  Photic stimulation did not alter the tracing.  During EEG recording, patient developed drowsiness and no deeper stage of sleep was achieved During EEG recording, there was no epileptiform discharge noted.  EKG demonstrate normal sinus rhythm.  CONCLUSION: This is a mild abnormal EEG.  There is evidence of mild background slowing indicating mild bihemispheric malfunction.  There was no electrodiagnostic evidence of epileptiform discharge.  Ravan Schlemmer, M.D. Ph.D.  [facility] [address] [place], [place] [address] Phone: [phone] Fax:      [phone]

## 2024-09-01 ENCOUNTER — Telehealth: Payer: Self-pay | Admitting: Neurology

## 2024-09-01 NOTE — Telephone Encounter (Signed)
 Appointment R/S due to weather

## 2024-09-03 ENCOUNTER — Ambulatory Visit: Admitting: Neurology

## 2024-09-24 ENCOUNTER — Ambulatory Visit: Admitting: Neurology
